# Patient Record
Sex: Female | Born: 1945 | Race: White | Hispanic: No | Smoking: Former smoker
Health system: Southern US, Community
[De-identification: ages and names within clinical notes are randomized; demographics above are authoritative.]

## PROBLEM LIST (undated history)

## (undated) DIAGNOSIS — M1990 Unspecified osteoarthritis, unspecified site: Secondary | ICD-10-CM

## (undated) DIAGNOSIS — I10 Essential (primary) hypertension: Secondary | ICD-10-CM

## (undated) DIAGNOSIS — T8859XA Other complications of anesthesia, initial encounter: Secondary | ICD-10-CM

## (undated) DIAGNOSIS — E039 Hypothyroidism, unspecified: Secondary | ICD-10-CM

## (undated) DIAGNOSIS — Z9889 Other specified postprocedural states: Secondary | ICD-10-CM

## (undated) HISTORY — PX: BREAST EXCISIONAL BIOPSY: SUR124

## (undated) HISTORY — PX: ABDOMINAL HYSTERECTOMY: SHX81

---
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
## 2002-02-17 ENCOUNTER — Other Ambulatory Visit: Admission: RE | Admit: 2002-02-17 | Discharge: 2002-02-17 | Payer: Self-pay | Admitting: Obstetrics and Gynecology

## 2002-10-30 ENCOUNTER — Ambulatory Visit (HOSPITAL_COMMUNITY): Admission: RE | Admit: 2002-10-30 | Discharge: 2002-10-30 | Payer: Self-pay | Admitting: Gastroenterology

## 2003-02-20 ENCOUNTER — Other Ambulatory Visit: Admission: RE | Admit: 2003-02-20 | Discharge: 2003-02-20 | Payer: Self-pay | Admitting: Obstetrics and Gynecology

## 2004-03-11 ENCOUNTER — Other Ambulatory Visit: Admission: RE | Admit: 2004-03-11 | Discharge: 2004-03-11 | Payer: Self-pay | Admitting: Obstetrics and Gynecology

## 2004-08-13 ENCOUNTER — Ambulatory Visit: Payer: Self-pay | Admitting: Internal Medicine

## 2004-12-21 ENCOUNTER — Emergency Department: Payer: Self-pay | Admitting: Emergency Medicine

## 2005-03-18 ENCOUNTER — Other Ambulatory Visit: Admission: RE | Admit: 2005-03-18 | Discharge: 2005-03-18 | Payer: Self-pay | Admitting: Obstetrics and Gynecology

## 2005-08-18 ENCOUNTER — Ambulatory Visit: Payer: Self-pay | Admitting: Internal Medicine

## 2006-08-30 ENCOUNTER — Ambulatory Visit: Payer: Self-pay | Admitting: Internal Medicine

## 2007-09-14 ENCOUNTER — Ambulatory Visit: Payer: Self-pay | Admitting: Internal Medicine

## 2008-03-21 DIAGNOSIS — D239 Other benign neoplasm of skin, unspecified: Secondary | ICD-10-CM

## 2008-03-21 HISTORY — DX: Other benign neoplasm of skin, unspecified: D23.9

## 2008-09-19 ENCOUNTER — Ambulatory Visit: Payer: Self-pay | Admitting: Internal Medicine

## 2009-09-20 ENCOUNTER — Ambulatory Visit: Payer: Self-pay | Admitting: Internal Medicine

## 2009-09-23 ENCOUNTER — Ambulatory Visit: Payer: Self-pay | Admitting: Internal Medicine

## 2010-09-23 ENCOUNTER — Ambulatory Visit: Payer: Self-pay | Admitting: Gastroenterology

## 2010-09-30 ENCOUNTER — Ambulatory Visit: Payer: Self-pay | Admitting: Internal Medicine

## 2011-10-01 ENCOUNTER — Ambulatory Visit: Payer: Self-pay | Admitting: Internal Medicine

## 2012-10-07 ENCOUNTER — Ambulatory Visit: Payer: Self-pay | Admitting: Internal Medicine

## 2013-09-13 ENCOUNTER — Ambulatory Visit: Payer: Self-pay | Admitting: Internal Medicine

## 2013-11-06 ENCOUNTER — Ambulatory Visit: Payer: Self-pay | Admitting: Internal Medicine

## 2013-11-07 ENCOUNTER — Ambulatory Visit: Payer: Self-pay | Admitting: Internal Medicine

## 2013-11-09 ENCOUNTER — Other Ambulatory Visit: Payer: Self-pay | Admitting: Internal Medicine

## 2013-11-09 DIAGNOSIS — R921 Mammographic calcification found on diagnostic imaging of breast: Secondary | ICD-10-CM

## 2013-11-20 ENCOUNTER — Ambulatory Visit
Admission: RE | Admit: 2013-11-20 | Discharge: 2013-11-20 | Disposition: A | Discharge status: Home / Self Care | Payer: 59 | Source: Ambulatory Visit | Attending: Internal Medicine | Admitting: Internal Medicine

## 2013-11-20 DIAGNOSIS — R921 Mammographic calcification found on diagnostic imaging of breast: Secondary | ICD-10-CM

## 2013-11-21 HISTORY — PX: BREAST BIOPSY: SHX20

## 2014-04-23 ENCOUNTER — Other Ambulatory Visit: Payer: Self-pay | Admitting: Internal Medicine

## 2014-04-23 DIAGNOSIS — R921 Mammographic calcification found on diagnostic imaging of breast: Secondary | ICD-10-CM

## 2014-05-14 ENCOUNTER — Ambulatory Visit
Admission: RE | Admit: 2014-05-14 | Discharge: 2014-05-14 | Disposition: A | Discharge status: Home / Self Care | Payer: Medicare Other | Source: Ambulatory Visit | Attending: Internal Medicine | Admitting: Internal Medicine

## 2014-05-14 DIAGNOSIS — R921 Mammographic calcification found on diagnostic imaging of breast: Secondary | ICD-10-CM

## 2014-09-07 ENCOUNTER — Other Ambulatory Visit: Payer: Self-pay | Admitting: Internal Medicine

## 2014-09-07 DIAGNOSIS — R921 Mammographic calcification found on diagnostic imaging of breast: Secondary | ICD-10-CM

## 2014-11-14 ENCOUNTER — Other Ambulatory Visit: Payer: Self-pay | Admitting: Internal Medicine

## 2014-11-14 ENCOUNTER — Ambulatory Visit
Admission: RE | Admit: 2014-11-14 | Discharge: 2014-11-14 | Disposition: A | Discharge status: Home / Self Care | Payer: Medicare Other | Source: Ambulatory Visit | Attending: Internal Medicine | Admitting: Internal Medicine

## 2014-11-14 ENCOUNTER — Ambulatory Visit: Payer: Self-pay

## 2014-11-14 DIAGNOSIS — R921 Mammographic calcification found on diagnostic imaging of breast: Secondary | ICD-10-CM

## 2015-03-25 IMAGING — MG MM LT BREAST BX W LOC DEV 1ST LESION IMAGE BX SPEC STEREO GUIDE
3 series · 3 of 3 positions shown · non-contrast
Comparison: Previous exams.

ADDENDUM:
Pathology revealed a hyalinized fibroadenoma with calcifications in
the left breast. This was found to be concordant by Dr. Klever
Nayko. Pathology was discussed with the patient by telephone. The
patient reported tenderness and bruising at the site. Post biopsy
instructions were reviewed and her questions were answered. She was
encouraged to call The [REDACTED] for any
additional concerns. She was asked to return in 6 months for
diagnostic right mammography for follow-up of calcifications.

Pathology results reported by Rinat Tabora RN, BSN on November 21, 2013.
CLINICAL DATA: Slightly suspicious left breast calcifications.
EXAM:
Left BREAST STEREOTACTIC CORE NEEDLE BIOPSY

[L SPECIMEN]
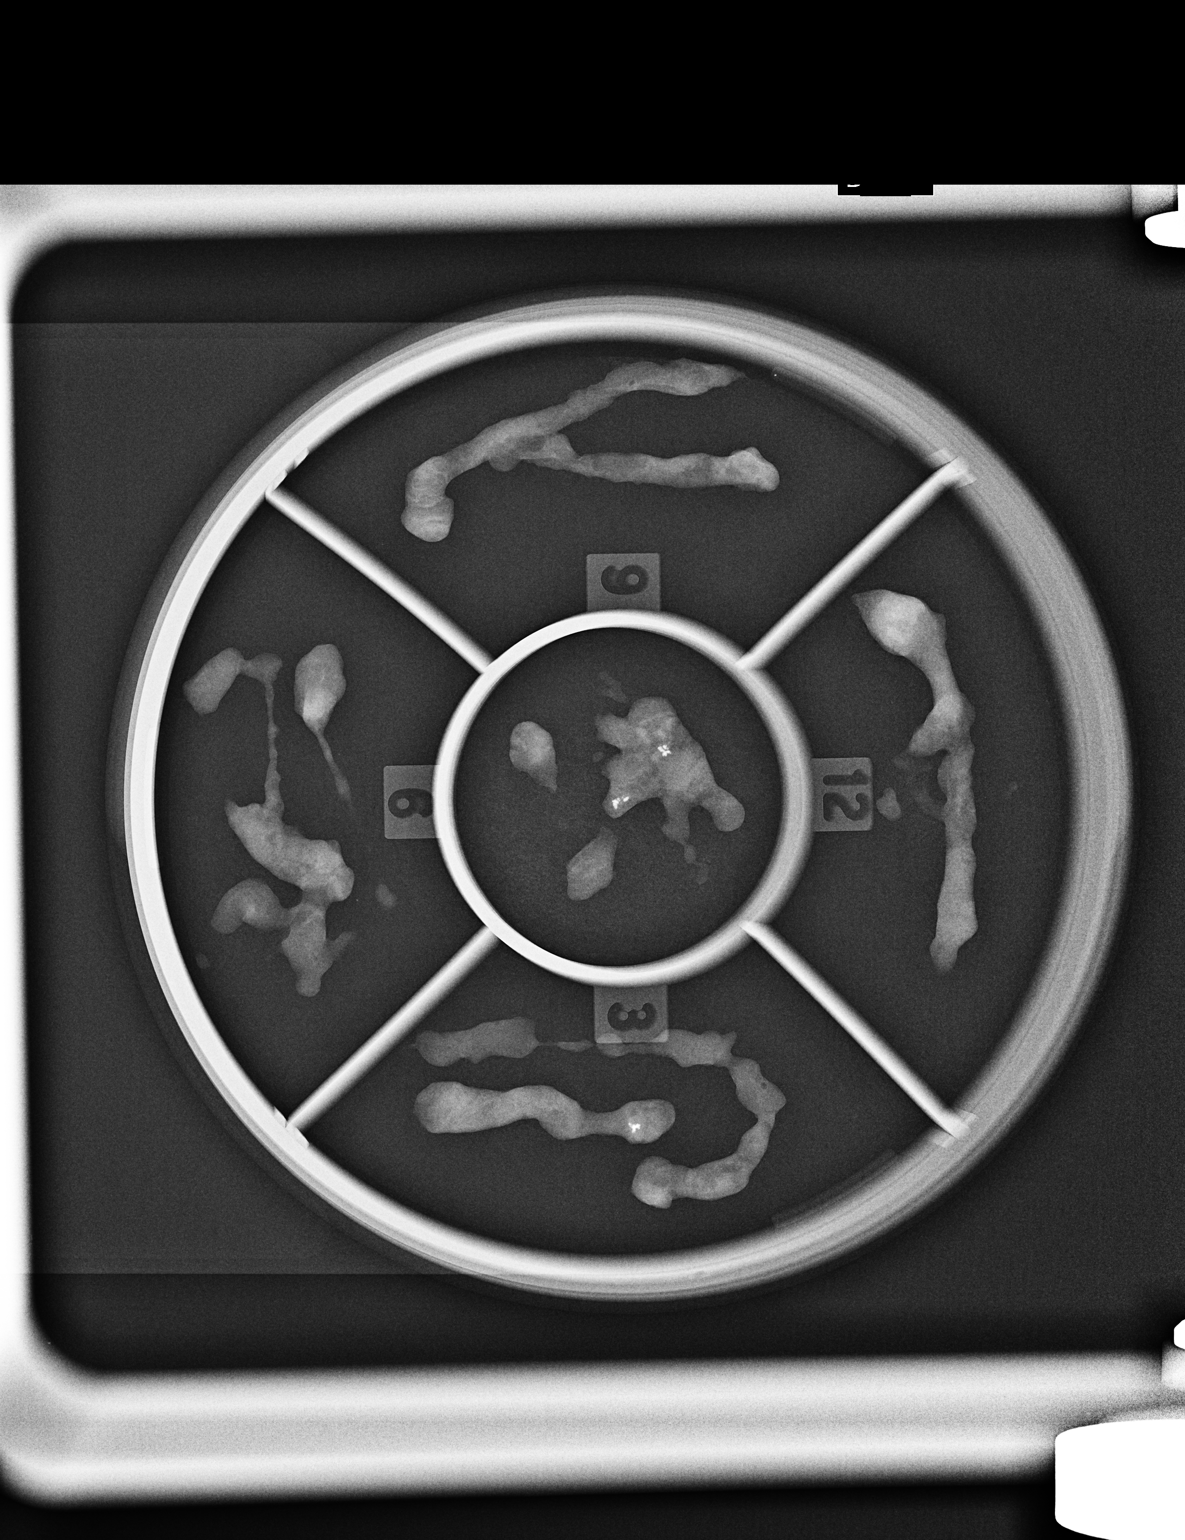

[L CC]
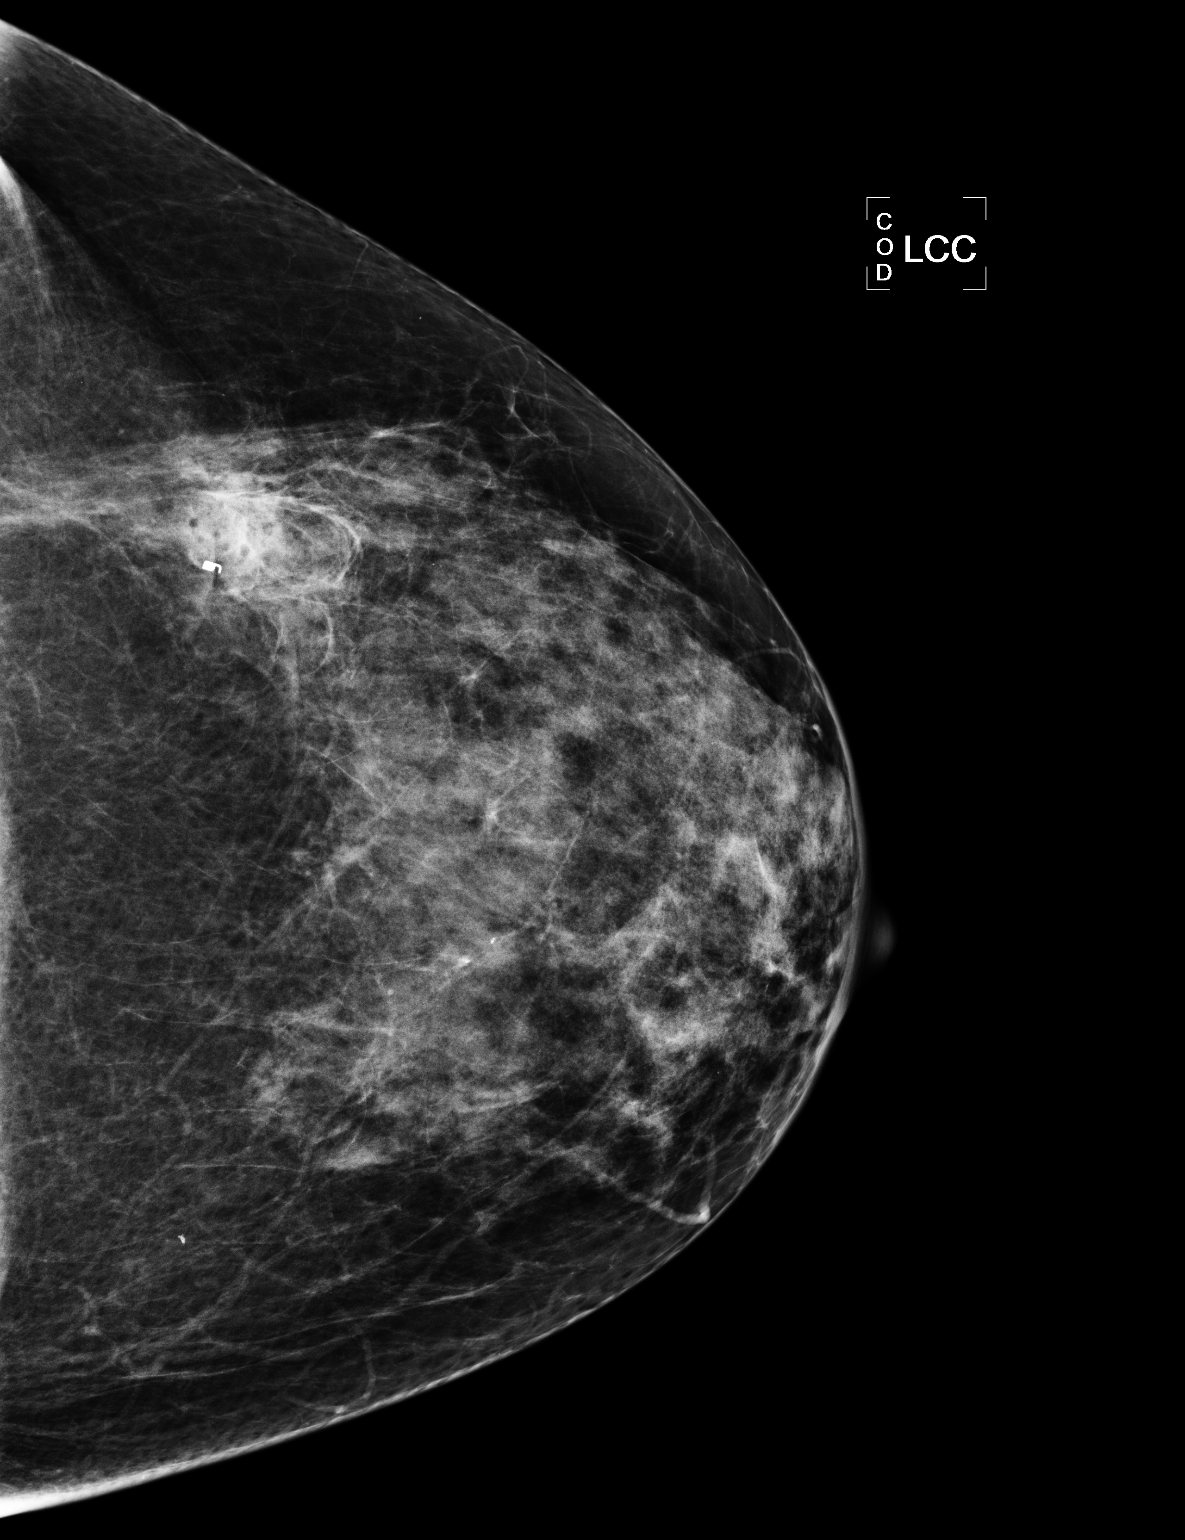

[L ML]
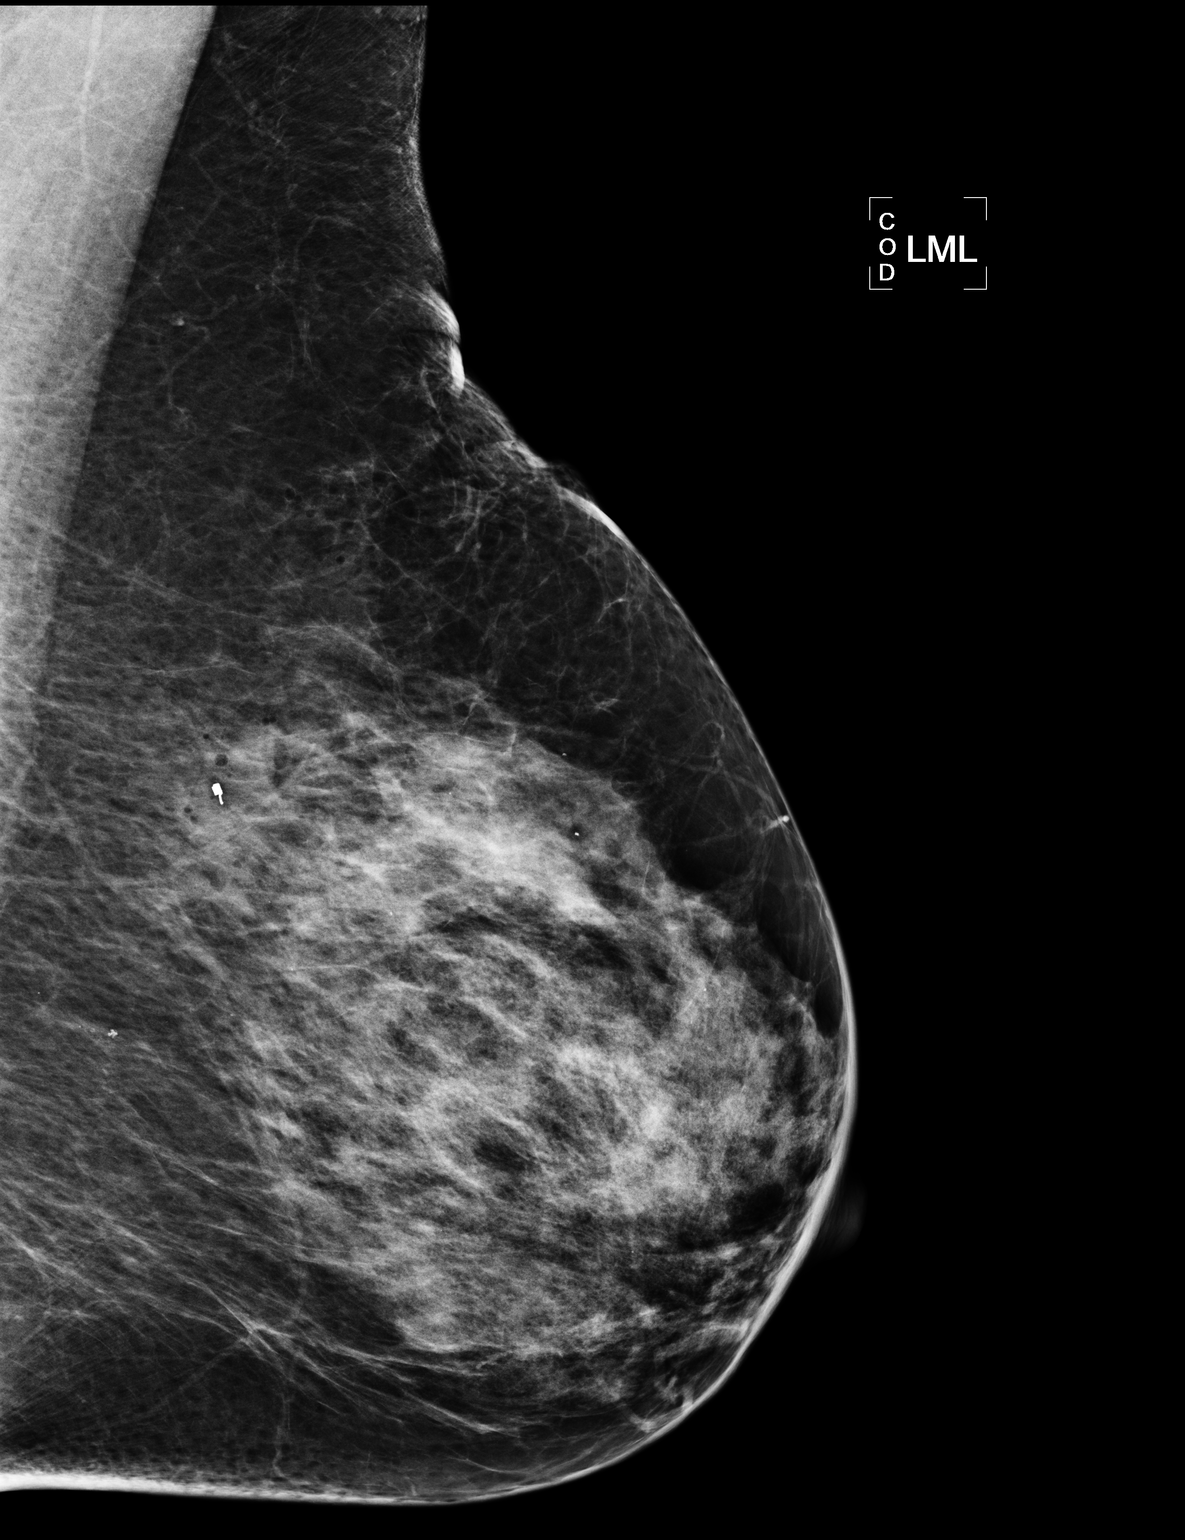

[3 of 3 positions shown; findings below may reference images not displayed]



Using sterile technique and 2% lidocaine as local anesthetic, under
stereotactic guidance, a 9 gauge vacuum assisted core biopsy device
was used to perform core needle biopsy of calcifications within the
upper outer quadrant of the left breast using a superior
craniocaudal approach. Specimen radiograph was performed showing
multiple representative calcifications within the specimen.
Specimens with calcifications are identified for pathology.

At the conclusion of the procedure, a coil shaped tissue marker clip
was deployed into the biopsy cavity. Follow-up 2-view mammogram
confirmed clip to be in appropriate position.
IMPRESSION: Stereotactic-guided biopsy of left breast calcifications as
discussed above. No apparent complications.

## 2015-06-26 ENCOUNTER — Other Ambulatory Visit: Payer: Self-pay | Admitting: Internal Medicine

## 2015-06-26 DIAGNOSIS — R921 Mammographic calcification found on diagnostic imaging of breast: Secondary | ICD-10-CM

## 2015-11-15 ENCOUNTER — Ambulatory Visit
Admission: RE | Admit: 2015-11-15 | Discharge: 2015-11-15 | Disposition: A | Discharge status: Home / Self Care | Payer: Medicare Other | Source: Ambulatory Visit | Attending: Internal Medicine | Admitting: Internal Medicine

## 2015-11-15 ENCOUNTER — Other Ambulatory Visit: Payer: Self-pay | Admitting: Internal Medicine

## 2015-11-15 DIAGNOSIS — R921 Mammographic calcification found on diagnostic imaging of breast: Secondary | ICD-10-CM

## 2016-06-26 ENCOUNTER — Other Ambulatory Visit: Payer: Self-pay | Admitting: Internal Medicine

## 2016-06-26 DIAGNOSIS — Z1231 Encounter for screening mammogram for malignant neoplasm of breast: Secondary | ICD-10-CM

## 2016-11-20 ENCOUNTER — Ambulatory Visit
Admission: RE | Admit: 2016-11-20 | Discharge: 2016-11-20 | Disposition: A | Discharge status: Home / Self Care | Payer: Medicare Other | Source: Ambulatory Visit | Attending: Internal Medicine | Admitting: Internal Medicine

## 2016-11-20 DIAGNOSIS — Z1231 Encounter for screening mammogram for malignant neoplasm of breast: Secondary | ICD-10-CM | POA: Diagnosis present

## 2017-06-30 ENCOUNTER — Other Ambulatory Visit: Payer: Self-pay | Admitting: Internal Medicine

## 2017-06-30 ENCOUNTER — Other Ambulatory Visit: Payer: Self-pay | Admitting: Podiatry

## 2017-06-30 DIAGNOSIS — Z1231 Encounter for screening mammogram for malignant neoplasm of breast: Secondary | ICD-10-CM

## 2017-07-06 ENCOUNTER — Other Ambulatory Visit: Payer: Self-pay | Admitting: Podiatry

## 2017-07-06 NOTE — Discharge Instructions (Signed)
[facility] °[facility] ° °POST OPERATIVE INSTRUCTIONS FOR DR. TROXLER AND DR. FOWLER °[facility] ° ° °1. Take your medication as prescribed.  Pain medication should be taken only as needed. ° °2. Keep the dressing clean, dry and intact. ° °3. Keep your foot elevated above the heart level for the first 48 hours. ° °4. Walking to the bathroom and brief periods of walking are acceptable, unless we have instructed you to be non-weight bearing. ° °5. Always wear your post-op shoe when walking.  Always use your crutches if you are to be non-weight bearing. ° °6. Do not take a shower. Baths are permissible as long as the foot is kept out of the water.  ° °7. Every hour you are awake:  °- Bend your knee 15 times. °- Flex foot 15 times °- Massage calf 15 times ° °8. Call [facility] ([phone]) if any of the following problems occur: °- You develop a temperature or fever. °- The bandage becomes saturated with blood. °- Medication does not stop your pain. °- Injury of the foot occurs. °- Any symptoms of infection including redness, odor, or red streaks running from wound. ° ° °General Anesthesia, Adult, Care After °These instructions provide you with information about caring for yourself after your procedure. Your health care provider may also give you more specific instructions. Your treatment has been planned according to current medical practices, but problems sometimes occur. Call your health care provider if you have any problems or questions after your procedure. °What can I expect after the procedure? °After the procedure, it is common to have: °· Vomiting. °· A sore throat. °· Mental slowness. ° °It is common to feel: °· Nauseous. °· Cold or shivery. °· Sleepy. °· Tired. °· Sore or achy, even in parts of your body where you did not have surgery. ° °Follow these instructions at home: °For at least 24 hours after the procedure: °· Do not: °? Participate in  activities where you could fall or become injured. °? Drive. °? Use heavy machinery. °? Drink alcohol. °? Take sleeping pills or medicines that cause drowsiness. °? Make important decisions or sign legal documents. °? Take care of children on your own. °· Rest. °Eating and drinking °· If you vomit, drink water, juice, or soup when you can drink without vomiting. °· Drink enough fluid to keep your urine clear or pale yellow. °· Make sure you have little or no nausea before eating solid foods. °· Follow the diet recommended by your health care provider. °General instructions °· Have a responsible adult stay with you until you are awake and alert. °· Return to your normal activities as told by your health care provider. Ask your health care provider what activities are safe for you. °· Take over-the-counter and prescription medicines only as told by your health care provider. °· If you smoke, do not smoke without supervision. °· Keep all follow-up visits as told by your health care provider. This is important. °Contact a health care provider if: °· You continue to have nausea or vomiting at home, and medicines are not helpful. °· You cannot drink fluids or start eating again. °· You cannot urinate after 8-12 hours. °· You develop a skin rash. °· You have fever. °· You have increasing redness at the site of your procedure. °Get help right away if: °· You have difficulty breathing. °· You have chest pain. °· You have unexpected bleeding. °· You feel that you   are having a life-threatening or urgent problem. °This information is not intended to replace advice given to you by your health care provider. Make sure you discuss any questions you have with your health care provider. °Document Released: 08/17/2000 Document Revised: 10/14/2015 Document Reviewed: 04/25/2015 °Elsevier Interactive Patient Education © 2018 [employer] ° °

## 2017-07-07 ENCOUNTER — Ambulatory Visit: Payer: Medicare Other | Admitting: Anesthesiology

## 2017-07-07 ENCOUNTER — Encounter: Payer: Self-pay | Admitting: *Deleted

## 2017-07-07 ENCOUNTER — Ambulatory Visit
Admission: RE | Admit: 2017-07-07 | Discharge: 2017-07-07 | Disposition: A | Discharge status: Home / Self Care | Payer: Medicare Other | Source: Ambulatory Visit | Attending: Podiatry | Admitting: Podiatry

## 2017-07-07 ENCOUNTER — Encounter
Admission: RE | Disposition: A | Discharge status: Home / Self Care | Payer: Self-pay | Source: Ambulatory Visit | Attending: Podiatry

## 2017-07-07 DIAGNOSIS — Z79899 Other long term (current) drug therapy: Secondary | ICD-10-CM | POA: Insufficient documentation

## 2017-07-07 DIAGNOSIS — Z888 Allergy status to other drugs, medicaments and biological substances status: Secondary | ICD-10-CM | POA: Insufficient documentation

## 2017-07-07 DIAGNOSIS — Z885 Allergy status to narcotic agent status: Secondary | ICD-10-CM | POA: Insufficient documentation

## 2017-07-07 DIAGNOSIS — M898X7 Other specified disorders of bone, ankle and foot: Secondary | ICD-10-CM | POA: Diagnosis not present

## 2017-07-07 DIAGNOSIS — E039 Hypothyroidism, unspecified: Secondary | ICD-10-CM | POA: Insufficient documentation

## 2017-07-07 DIAGNOSIS — Z87891 Personal history of nicotine dependence: Secondary | ICD-10-CM | POA: Insufficient documentation

## 2017-07-07 DIAGNOSIS — M2012 Hallux valgus (acquired), left foot: Secondary | ICD-10-CM | POA: Insufficient documentation

## 2017-07-07 DIAGNOSIS — Z7982 Long term (current) use of aspirin: Secondary | ICD-10-CM | POA: Diagnosis not present

## 2017-07-07 DIAGNOSIS — M19042 Primary osteoarthritis, left hand: Secondary | ICD-10-CM | POA: Diagnosis not present

## 2017-07-07 DIAGNOSIS — E785 Hyperlipidemia, unspecified: Secondary | ICD-10-CM | POA: Insufficient documentation

## 2017-07-07 DIAGNOSIS — I10 Essential (primary) hypertension: Secondary | ICD-10-CM | POA: Insufficient documentation

## 2017-07-07 DIAGNOSIS — Z91041 Radiographic dye allergy status: Secondary | ICD-10-CM | POA: Diagnosis not present

## 2017-07-07 DIAGNOSIS — M19041 Primary osteoarthritis, right hand: Secondary | ICD-10-CM | POA: Diagnosis not present

## 2017-07-07 HISTORY — DX: Hypothyroidism, unspecified: E03.9

## 2017-07-07 HISTORY — DX: Unspecified osteoarthritis, unspecified site: M19.90

## 2017-07-07 HISTORY — DX: Essential (primary) hypertension: I10

## 2017-07-07 HISTORY — PX: METATARSAL OSTEOTOMY: SHX1641

## 2017-07-07 HISTORY — PX: EXCISION PARTIAL PHALANX: SHX6617

## 2017-07-07 HISTORY — DX: Other specified postprocedural states: Z98.890

## 2017-07-07 SURGERY — OSTEOTOMY, METATARSAL BONE
Anesthesia: General | Site: Foot | Laterality: Left | Wound class: Clean

## 2017-07-07 MED ORDER — BUPIVACAINE LIPOSOME 1.3 % IJ SUSP
INTRAMUSCULAR | Status: DC | PRN
  Administered 2017-07-07: 10 mL

## 2017-07-07 MED ORDER — ONDANSETRON HCL 4 MG/2ML IJ SOLN
INTRAMUSCULAR | Status: DC | PRN
  Administered 2017-07-07: 4 mg via INTRAVENOUS

## 2017-07-07 MED ORDER — LIDOCAINE HCL (CARDIAC) 20 MG/ML IV SOLN
INTRAVENOUS | Status: DC | PRN
  Administered 2017-07-07: 40 mg via INTRATRACHEAL

## 2017-07-07 MED ORDER — PROMETHAZINE HCL 12.5 MG PO TABS: 12.5000 mg | ORAL_TABLET | Freq: Four times a day (QID) | ORAL | 0 refills | Status: DC | PRN

## 2017-07-07 MED ORDER — CEFAZOLIN SODIUM-DEXTROSE 2-4 GM/100ML-% IV SOLN
2.0000 g | INTRAVENOUS | Status: AC
  Administered 2017-07-07: 2 g via INTRAVENOUS

## 2017-07-07 MED ORDER — FENTANYL CITRATE (PF) 100 MCG/2ML IJ SOLN
INTRAMUSCULAR | Status: DC | PRN
  Administered 2017-07-07: 50 ug via INTRAVENOUS

## 2017-07-07 MED ORDER — LACTATED RINGERS IV SOLN
10.0000 mL/h | INTRAVENOUS | Status: DC
  Administered 2017-07-07: 10 mL/h via INTRAVENOUS

## 2017-07-07 MED ORDER — MIDAZOLAM HCL 5 MG/5ML IJ SOLN
INTRAMUSCULAR | Status: DC | PRN
  Administered 2017-07-07: 2 mg via INTRAVENOUS

## 2017-07-07 MED ORDER — PROMETHAZINE HCL 25 MG/ML IJ SOLN: 6.2500 mg | INTRAMUSCULAR | Status: DC | PRN

## 2017-07-07 MED ORDER — ONDANSETRON HCL 4 MG/2ML IJ SOLN: 4.0000 mg | Freq: Four times a day (QID) | INTRAMUSCULAR | Status: DC | PRN

## 2017-07-07 MED ORDER — ONDANSETRON HCL 4 MG PO TABS: 4.0000 mg | ORAL_TABLET | Freq: Four times a day (QID) | ORAL | Status: DC | PRN

## 2017-07-07 MED ORDER — DEXAMETHASONE SODIUM PHOSPHATE 4 MG/ML IJ SOLN
INTRAMUSCULAR | Status: DC | PRN
  Administered 2017-07-07: 4 mg via INTRAVENOUS

## 2017-07-07 MED ORDER — PROPOFOL 10 MG/ML IV BOLUS
INTRAVENOUS | Status: DC | PRN
  Administered 2017-07-07: 140 mg via INTRAVENOUS

## 2017-07-07 MED ORDER — MEPERIDINE HCL 25 MG/ML IJ SOLN: 6.2500 mg | INTRAMUSCULAR | Status: DC | PRN

## 2017-07-07 MED ORDER — OXYCODONE HCL 5 MG/5ML PO SOLN: 5.0000 mg | Freq: Once | ORAL | Status: DC | PRN

## 2017-07-07 MED ORDER — OXYCODONE HCL 5 MG PO TABS: 5.0000 mg | ORAL_TABLET | Freq: Once | ORAL | Status: DC | PRN

## 2017-07-07 MED ORDER — ACETAMINOPHEN 325 MG PO TABS
650.0000 mg | ORAL_TABLET | Freq: Once | ORAL | Status: AC
  Administered 2017-07-07: 650 mg via ORAL

## 2017-07-07 MED ORDER — HYDROCODONE-ACETAMINOPHEN 5-325 MG PO TABS: 1.0000 | ORAL_TABLET | Freq: Four times a day (QID) | ORAL | 0 refills | Status: DC | PRN

## 2017-07-07 MED ORDER — FENTANYL CITRATE (PF) 100 MCG/2ML IJ SOLN: 25.0000 ug | INTRAMUSCULAR | Status: DC | PRN

## 2017-07-07 MED ORDER — GLYCOPYRROLATE 0.2 MG/ML IJ SOLN
INTRAMUSCULAR | Status: DC | PRN
  Administered 2017-07-07: 0.1 mg via INTRAVENOUS

## 2017-07-07 MED ORDER — CHLORHEXIDINE GLUCONATE 4 % EX LIQD: 60.0000 mL | Freq: Once | CUTANEOUS | Status: DC

## 2017-07-07 MED ORDER — BUPIVACAINE HCL (PF) 0.25 % IJ SOLN
INTRAMUSCULAR | Status: DC | PRN
  Administered 2017-07-07: 10 mL

## 2017-07-07 SURGICAL SUPPLY — 53 items
APL SKNCLS STERI-STRIP NONHPOA (GAUZE/BANDAGES/DRESSINGS) ×1
APPLICATOR CHLORAPREP 1ML CHG (MISCELLANEOUS) ×1 IMPLANT
BANDAGE ELASTIC 4 VELCRO NS (GAUZE/BANDAGES/DRESSINGS) ×2 IMPLANT
BENZOIN TINCTURE PRP APPL 2/3 (GAUZE/BANDAGES/DRESSINGS) ×2 IMPLANT
BLADE MED AGGRESSIVE (BLADE) ×1 IMPLANT
BLADE MINI RND TIP GREEN BEAV (BLADE) ×1 IMPLANT
BLADE OSC/SAGITTAL MD 5.5X18 (BLADE) ×1 IMPLANT
BLADE SURG 15 STRL LF DISP TIS (BLADE) IMPLANT
BLADE SURG 15 STRL SS (BLADE)
BNDG CMPR 75X41 PLY HI ABS (GAUZE/BANDAGES/DRESSINGS) ×1
BNDG COHESIVE 4X5 TAN STRL (GAUZE/BANDAGES/DRESSINGS) ×2 IMPLANT
BNDG ESMARK 4X12 TAN STRL LF (GAUZE/BANDAGES/DRESSINGS) ×2 IMPLANT
BNDG GAUZE 4.5X4.1 6PLY STRL (MISCELLANEOUS) ×2 IMPLANT
BNDG STRETCH 4X75 STRL LF (GAUZE/BANDAGES/DRESSINGS) ×2 IMPLANT
CANISTER SUCT 1200ML W/VALVE (MISCELLANEOUS) ×2 IMPLANT
CHLORAPREP W/TINT 26ML (MISCELLANEOUS) ×1 IMPLANT
COVER LIGHT HANDLE UNIVERSAL (MISCELLANEOUS) ×4 IMPLANT
CUFF TOURN SGL QUICK 18 (TOURNIQUET CUFF) ×1 IMPLANT
DRAPE FLUOR MINI C-ARM 54X84 (DRAPES) ×2 IMPLANT
ELECT REM PT RETURN 9FT ADLT (ELECTROSURGICAL) ×2
ELECTRODE REM PT RTRN 9FT ADLT (ELECTROSURGICAL) ×1 IMPLANT
GAUZE PETRO XEROFOAM 1X8 (MISCELLANEOUS) ×2 IMPLANT
GAUZE SPONGE 4X4 12PLY STRL (GAUZE/BANDAGES/DRESSINGS) ×2 IMPLANT
GLOVE BIO SURGEON STRL SZ7.5 (GLOVE) ×3 IMPLANT
GLOVE INDICATOR 8.0 STRL GRN (GLOVE) ×3 IMPLANT
GOWN STRL REUS W/ TWL LRG LVL3 (GOWN DISPOSABLE) ×2 IMPLANT
GOWN STRL REUS W/TWL LRG LVL3 (GOWN DISPOSABLE) ×4
K-WIRE DBL END TROCAR 6X.045 (WIRE) ×2
K-WIRE DBL END TROCAR 6X.062 (WIRE)
K-WIRE DBL TROCAR .062X4 ×2 IMPLANT
KIT TURNOVER KIT A (KITS) ×2 IMPLANT
KWIRE DBL END TROCAR 6X.045 (WIRE) IMPLANT
KWIRE DBL END TROCAR 6X.062 (WIRE) IMPLANT
KWIRE DBL TROCAR .062X4 IMPLANT
NS IRRIG 500ML POUR BTL (IV SOLUTION) ×2 IMPLANT
PACK EXTREMITY ARMC (MISCELLANEOUS) ×2 IMPLANT
PIN BALLS 3/8 F/.045 WIRE (MISCELLANEOUS) ×1 IMPLANT
RASP SM TEAR CROSS CUT (RASP) ×1 IMPLANT
STAPLE BONE 9X9X9 (Staple) ×1 IMPLANT
STOCKINETTE IMPERVIOUS LG (DRAPES) ×2 IMPLANT
STRAP BODY AND KNEE 60X3 (MISCELLANEOUS) ×1 IMPLANT
STRIP CLOSURE SKIN 1/4X4 (GAUZE/BANDAGES/DRESSINGS) ×2 IMPLANT
SUT ETHILON 4-0 (SUTURE) ×2
SUT ETHILON 4-0 FS2 18XMFL BLK (SUTURE) ×1
SUT ETHILON 5-0 FS-2 18 BLK (SUTURE) IMPLANT
SUT MNCRL 5-0+ PC-1 (SUTURE) IMPLANT
SUT MONOCRYL 5-0 (SUTURE) ×1
SUT VIC AB 2-0 SH 27 (SUTURE)
SUT VIC AB 2-0 SH 27XBRD (SUTURE) IMPLANT
SUT VIC AB 3-0 SH 27 (SUTURE) ×2
SUT VIC AB 3-0 SH 27X BRD (SUTURE) IMPLANT
SUT VIC AB 4-0 FS2 27 (SUTURE) ×1 IMPLANT
SUTURE ETHLN 4-0 FS2 18XMF BLK (SUTURE) IMPLANT

## 2017-07-07 NOTE — Anesthesia Preprocedure Evaluation (Signed)
Anesthesia Evaluation  Patient identified by MRN, date of birth, ID band Patient awake    Reviewed: Allergy & Precautions, H&P , NPO status , Patient's Chart, lab work & pertinent test results, reviewed documented beta blocker date and time   Airway Mallampati: II  TM Distance: >3 FB Neck ROM: full    Dental no notable dental hx.    Pulmonary neg pulmonary ROS, former smoker,    Pulmonary exam normal breath sounds clear to auscultation       Cardiovascular Exercise Tolerance: Good hypertension, negative cardio ROS   Rhythm:regular Rate:Normal     Neuro/Psych negative neurological ROS  negative psych ROS   GI/Hepatic negative GI ROS, Neg liver ROS,   Endo/Other  Hypothyroidism   Renal/GU negative Renal ROS  negative genitourinary   Musculoskeletal   Abdominal   Peds  Hematology negative hematology ROS (+)   Anesthesia Other Findings Arthritis  Reproductive/Obstetrics negative OB ROS                             Anesthesia Physical Anesthesia Plan  ASA: II  Anesthesia Plan: General LMA   Post-op Pain Management:    Induction:   PONV Risk Score and Plan:   Airway Management Planned:   Additional Equipment:   Intra-op Plan:   Post-operative Plan:   Informed Consent: I have reviewed the patients History and Physical, chart, labs and discussed the procedure including the risks, benefits and alternatives for the proposed anesthesia with the patient or authorized representative who has indicated his/her understanding and acceptance.   Dental Advisory Given  Plan Discussed with: CRNA  Anesthesia Plan Comments:         Anesthesia Quick Evaluation

## 2017-07-07 NOTE — Anesthesia Postprocedure Evaluation (Signed)
Anesthesia Post Note  Patient: Pamela Bowen  Procedure(s) Performed: METATARSAL OSTEOTOMY & GREAT TOE(DOUBLE OSTEOTOMY) (Left Foot) EXCISION PARTIAL PHALANX-T7 (Left Foot)  Patient location during evaluation: PACU Anesthesia Type: General Level of consciousness: awake and alert Pain management: pain level controlled Vital Signs Assessment: post-procedure vital signs reviewed and stable Respiratory status: spontaneous breathing, nonlabored ventilation, respiratory function stable and patient connected to nasal cannula oxygen Cardiovascular status: blood pressure returned to baseline and stable Postop Assessment: no apparent nausea or vomiting Anesthetic complications: no    Alessa Mazur ELAINE

## 2017-07-07 NOTE — H&P (Signed)
HISTORY AND PHYSICAL INTERVAL NOTE:  07/07/2017  11:41 AM  Pamela Bowen  has presented today for surgery, with the diagnosis of Hallux Valgus-Left Hammertoe - Left.  The various methods of treatment have been discussed with the patient.  No guarantees were given.  After consideration of risks, benefits and other options for treatment, the patient has consented to surgery.  I have reviewed the patients' chart and labs.    No data found.  A history and physical examination was performed in my office.  The patient was reexamined.  There have been no changes to this history and physical examination.  Samara Deist A

## 2017-07-07 NOTE — Anesthesia Procedure Notes (Signed)
Procedure Name: LMA Insertion Date/Time: 07/07/2017 12:04 PM Performed by: Mayme Genta, CRNA Pre-anesthesia Checklist: Patient identified, Emergency Drugs available, Suction available, Timeout performed and Patient being monitored Patient Re-evaluated:Patient Re-evaluated prior to induction Oxygen Delivery Method: Circle system utilized Preoxygenation: Pre-oxygenation with 100% oxygen Induction Type: IV induction LMA: LMA inserted LMA Size: 4.0 Number of attempts: 1 Placement Confirmation: positive ETCO2 and breath sounds checked- equal and bilateral Tube secured with: Tape

## 2017-07-07 NOTE — Transfer of Care (Signed)
Immediate Anesthesia Transfer of Care Note  Patient: Pamela Bowen  Procedure(s) Performed: METATARSAL OSTEOTOMY & GREAT TOE(DOUBLE OSTEOTOMY) (Left Foot) EXCISION PARTIAL PHALANX-T7 (Left Foot)  Patient Location: PACU  Anesthesia Type: General LMA  Level of Consciousness: awake, alert  and patient cooperative  Airway and Oxygen Therapy: Patient Spontanous Breathing and Patient connected to supplemental oxygen  Post-op Assessment: Post-op Vital signs reviewed, Patient's Cardiovascular Status Stable, Respiratory Function Stable, Patent Airway and No signs of Nausea or vomiting  Post-op Vital Signs: Reviewed and stable  Complications: No apparent anesthesia complications

## 2017-07-07 NOTE — Op Note (Signed)
Operative note   Surgeon:Carley Glendenning    Assistant:None    Preop diagnosis: 1.  Left foot hallux valgus deformity 2.  Exostosis left second toe 3.  Exostosis left great toe    Postop diagnosis: Same    Procedure:1.  Austin with Akin double osteotomy hallux valgus correction left foot 2.  Exostectomy PIPJ left second toe 3.  Exostectomy distal lateral phalanx great toe    EBL: Minimal    Anesthesia:local and general.  Local consisted of a one-to-one mixture of 0.25% bupivacaine and Exparel long-acting anesthetic.  A total of 20 cc was used.    Hemostasis: Ankle tourniquet inflated to 200 mmHg for approximately 60 minutes    Specimen: None    Complications: None    Operative indications:Pamela Bowen is an 72 y.o. that presents today for surgical intervention.  The risks/benefits/alternatives/complications have been discussed and consent has been given.    Procedure:  Patient was brought into the OR and placed on the operating table in thesupine position. After anesthesia was obtained theleft lower extremity was prepped and draped in usual sterile fashion.  Attention was directed to the dorsomedial left first MTPJ where a dorsal medial incision was performed.  Sharp and blunt dissection carried down to the capsule.  A T capsulotomy was then performed.  The dorsomedial eminence was noted and transected with a power saw.  A V osteotomy was created in the distal first metatarsal.  The capital fragment was translocated laterally.  This was stabilized with a 0.062 K wire.  This was bent appropriately against the first metatarsal.  Attention was then directed to the proximal phalanx where a small Akin wedge osteotomy was created.  The wedge was removed.  The lateral cortex was left intact.  This was compressed and stabilized with a 9 mm compression staple.  The wound was flushed with copious amounts of irrigation.  A small capsulorrhaphy was performed medially.  Closure was performed with a  3-0 Vicryl 4-0 Vicryl and 5-0 Monocryl for skin.  Attention was then directed to the medial aspect of the second toe at the PIPJ were incision was made.  Sharp and blunt dissection carried down to the capsule.  The capsule was exposed and the medial portion of the proximal phalangeal head was noted.  This was then transected with a power saw.  This was flushed with copious amounts of irrigation.  Closure was performed with a 4-0 Vicryl and 4-0 nylon.  Final incision was made at the distal lateral aspect of the great toe overlying the distal lateral phalanx.  Sharp and blunt dissection carried down to the phalanx.  Subperiosteal dissection exposed the lateral portion of the distal phalanx.  Lateral exostosis of the phalanx was excised with a power saw.  This was flushed with copious amounts of irrigation.  Closure was performed with a 4-0 Vicryl and 4-0 nylon.  The dorsomedial incision was closed with 5-0 Monocryl.  A well compressive sterile dressing was then applied.    Patient tolerated the procedure and anesthesia well.  Was transported from the OR to the PACU with all vital signs stable and vascular status intact. To be discharged per routine protocol.  Will follow up in approximately 1 week in the outpatient clinic.

## 2017-07-08 ENCOUNTER — Encounter: Payer: Self-pay | Admitting: Podiatry

## 2017-11-24 ENCOUNTER — Ambulatory Visit
Admission: RE | Admit: 2017-11-24 | Discharge: 2017-11-24 | Disposition: A | Discharge status: Home / Self Care | Payer: Medicare Other | Source: Ambulatory Visit | Attending: Internal Medicine | Admitting: Internal Medicine

## 2017-11-24 DIAGNOSIS — Z1231 Encounter for screening mammogram for malignant neoplasm of breast: Secondary | ICD-10-CM

## 2018-07-22 ENCOUNTER — Other Ambulatory Visit: Payer: Self-pay | Admitting: Internal Medicine

## 2018-07-22 DIAGNOSIS — Z1231 Encounter for screening mammogram for malignant neoplasm of breast: Secondary | ICD-10-CM

## 2019-01-13 ENCOUNTER — Other Ambulatory Visit: Payer: Self-pay

## 2019-01-13 ENCOUNTER — Ambulatory Visit
Admission: RE | Admit: 2019-01-13 | Discharge: 2019-01-13 | Disposition: A | Discharge status: Home / Self Care | Payer: Medicare Other | Source: Ambulatory Visit | Attending: Internal Medicine | Admitting: Internal Medicine

## 2019-01-13 DIAGNOSIS — Z1231 Encounter for screening mammogram for malignant neoplasm of breast: Secondary | ICD-10-CM | POA: Insufficient documentation

## 2019-06-06 ENCOUNTER — Ambulatory Visit: Payer: Medicare Other | Attending: Internal Medicine

## 2019-06-06 DIAGNOSIS — Z23 Encounter for immunization: Secondary | ICD-10-CM | POA: Insufficient documentation

## 2019-06-06 NOTE — Progress Notes (Signed)
   Covid-19 Vaccination Clinic  Name:  Pamela Bowen    MRN: [id number] DOB: [birth year]  06/06/2019  Pamela Bowen was observed post Covid-19 immunization for 15 minutes without incidence. She was provided with Vaccine Information Sheet and instruction to access the V-Safe system.   Pamela Bowen was instructed to call 911 with any severe reactions post vaccine: Marland Kitchen Difficulty breathing  . Swelling of your face and throat  . A fast heartbeat  . A bad rash all over your body  . Dizziness and weakness    Immunizations Administered    Name Date Dose VIS Date Route   Pfizer COVID-19 Vaccine 06/06/2019 12:13 PM 0.3 mL 05/05/2019 Intramuscular   Manufacturer: [employer], [employer]   Lot: [id number]   [place]: [id number]

## 2019-06-26 ENCOUNTER — Ambulatory Visit: Payer: Medicare PPO | Attending: Internal Medicine

## 2019-06-26 ENCOUNTER — Ambulatory Visit: Payer: Self-pay

## 2019-06-26 DIAGNOSIS — Z23 Encounter for immunization: Secondary | ICD-10-CM | POA: Insufficient documentation

## 2019-06-26 NOTE — Progress Notes (Signed)
   Covid-19 Vaccination Clinic  Name:  Pamela Bowen    MRN: [id number] DOB: 1946/01/02  06/26/2019  Pamela Bowen was observed post Covid-19 immunization for 15 minutes without incidence. She was provided with Vaccine Information Sheet and instruction to access the V-Safe system.   Pamela Bowen was instructed to call 911 with any severe reactions post vaccine: Marland Kitchen Difficulty breathing  . Swelling of your face and throat  . A fast heartbeat  . A bad rash all over your body  . Dizziness and weakness    Immunizations Administered    Name Date Dose VIS Date Route   Pfizer COVID-19 Vaccine 06/26/2019  9:32 AM 0.3 mL 05/05/2019 Intramuscular   Manufacturer: [place]   Lot: [id number]   [place]: [id number]

## 2019-07-26 ENCOUNTER — Other Ambulatory Visit: Payer: Self-pay | Admitting: Internal Medicine

## 2019-07-26 DIAGNOSIS — Z1231 Encounter for screening mammogram for malignant neoplasm of breast: Secondary | ICD-10-CM

## 2019-08-14 ENCOUNTER — Ambulatory Visit: Payer: Medicare PPO | Admitting: Dermatology

## 2019-08-14 ENCOUNTER — Other Ambulatory Visit: Payer: Self-pay

## 2019-08-14 ENCOUNTER — Encounter: Payer: Self-pay | Admitting: Dermatology

## 2019-08-14 DIAGNOSIS — L853 Xerosis cutis: Secondary | ICD-10-CM

## 2019-08-14 DIAGNOSIS — D1801 Hemangioma of skin and subcutaneous tissue: Secondary | ICD-10-CM

## 2019-08-14 DIAGNOSIS — L814 Other melanin hyperpigmentation: Secondary | ICD-10-CM

## 2019-08-14 DIAGNOSIS — L578 Other skin changes due to chronic exposure to nonionizing radiation: Secondary | ICD-10-CM

## 2019-08-14 DIAGNOSIS — L821 Other seborrheic keratosis: Secondary | ICD-10-CM

## 2019-08-14 DIAGNOSIS — I781 Nevus, non-neoplastic: Secondary | ICD-10-CM

## 2019-08-14 DIAGNOSIS — L820 Inflamed seborrheic keratosis: Secondary | ICD-10-CM

## 2019-08-14 DIAGNOSIS — L570 Actinic keratosis: Secondary | ICD-10-CM | POA: Diagnosis not present

## 2019-08-14 DIAGNOSIS — L309 Dermatitis, unspecified: Secondary | ICD-10-CM

## 2019-08-14 DIAGNOSIS — Z86018 Personal history of other benign neoplasm: Secondary | ICD-10-CM

## 2019-08-14 DIAGNOSIS — Z1283 Encounter for screening for malignant neoplasm of skin: Secondary | ICD-10-CM | POA: Diagnosis not present

## 2019-08-14 DIAGNOSIS — D225 Melanocytic nevi of trunk: Secondary | ICD-10-CM

## 2019-08-14 DIAGNOSIS — D229 Melanocytic nevi, unspecified: Secondary | ICD-10-CM

## 2019-08-14 MED ORDER — TRIAMCINOLONE ACETONIDE 0.1 % EX OINT: 1.0000 "application " | TOPICAL_OINTMENT | Freq: Two times a day (BID) | CUTANEOUS | 1 refills | Status: DC

## 2019-08-14 NOTE — Progress Notes (Signed)
Follow-Up Visit   Subjective  Pamela Bowen is a 74 y.o. female who presents for the following: Annual Exam (hx of dysplastic nevi Mid back, R lower leg), Eczema (hands, used topical steroid in past), and spot (L arm, lightens then gets dark again).   The following portions of the chart were reviewed this encounter and updated as appropriate:     Review of Systems: No other skin or systemic complaints.  Objective  Well appearing patient in no apparent distress; mood and affect are within normal limits.  A full examination was performed including scalp, head, eyes, ears, nose, lips, neck, chest, axillae, abdomen, back, buttocks, bilateral upper extremities, bilateral lower extremities, hands, feet, fingers, toes, fingernails, and toenails. All findings within normal limits unless otherwise noted below.  Objective  Nasal dorsum x 2 (2): Pink scaly macules   Objective  chest, face: Actinic changes   Objective  bil hands: Hyperkeratosis fingertips with small fissures and mild erythema, xerosis palms and hands  Objective  chest: Red paps.  Objective  Mid Back, R lower leg: Dysplastic nevi sites clear.  Objective    Right upper Flank at braline x 1: Waxy brown papule with erythema- irritated by bra  L mid forearm x 1,: violaceous waxy flat pap C/W ISK with underlying purpura  Objective  back: Scattered tan macules.   Objective  Right Upper Back: 4.61mm medium brown macule  Objective  Back, Right pretibia: Waxy tan patch.  Objective  bil legs: Telangiectasias legs  Objective  arms, legs, trunk, hands: Severe xerosis, body, legs, palms, hands  Assessment & Plan  AK (actinic keratosis) (2) Nasal dorsum x 2  Destruction of lesion - Nasal dorsum x 2  Destruction method: cryotherapy   Informed consent: discussed and consent obtained   Lesion destroyed using liquid nitrogen: Yes   Region frozen until ice ball extended beyond lesion: Yes   Outcome:  patient tolerated procedure well with no complications   Post-procedure details: wound care instructions given   Additional details:  Cryotherapy Aftercare  Wash gently with soap and water everyday.   Apply Vaseline and Band-Aid daily until healed.    Actinic skin damage chest, face  Recommend broad spectrum SPF and photoprotection   Hand dermatitis bil hands  Minimize soap/water exposure.  Mild soap (Dove) and moisturizer after handwashing.  triamcinolone ointment (KENALOG) 0.1 % - bil hands  Hemangioma of skin chest  Benign.  Observe.   History of dysplastic nevus Mid Back, R lower leg  Clear. Observe for recurrence.  Recommend regular skin exams, sunscreen use, and photoprotection.    Inflamed seborrheic keratosis (2) L mid forearm x 1,; Right upper Flank at braline x 1  Destruction of lesion - L mid forearm x 1,, Right upper Flank at braline x 1  Destruction method: cryotherapy   Informed consent: discussed and consent obtained   Lesion destroyed using liquid nitrogen: Yes   Region frozen until ice ball extended beyond lesion: Yes   Outcome: patient tolerated procedure well with no complications   Post-procedure details: wound care instructions given    Lentigines back  Benign.  Observe.   Nevus Right Upper Back  Benign-appearing.  Observation   Seborrheic keratosis (2) Right pretibia; Back  Benign, observe.    Skin cancer screening TBSE  Spider veins bil legs  Benign, observe.    Xerosis cutis arms, legs, trunk, hands  Recommend mild soap and moisturizer qd Recommend Amlactin Rapid Relief, Cerave SA, or Eucerin roughness relief bid,  samples given.  Return in about 1 year (around 08/13/2020) for TBSE.   I, Othelia Pulling, RMA, am acting as scribe for Brendolyn Patty, MD .

## 2020-01-24 ENCOUNTER — Ambulatory Visit
Admission: RE | Admit: 2020-01-24 | Discharge: 2020-01-24 | Disposition: A | Discharge status: Home / Self Care | Payer: Medicare PPO | Source: Ambulatory Visit | Attending: Internal Medicine | Admitting: Internal Medicine

## 2020-01-24 ENCOUNTER — Other Ambulatory Visit: Payer: Self-pay

## 2020-01-24 DIAGNOSIS — Z1231 Encounter for screening mammogram for malignant neoplasm of breast: Secondary | ICD-10-CM

## 2020-03-19 ENCOUNTER — Ambulatory Visit: Payer: Medicare PPO | Admitting: Dermatology

## 2020-03-19 ENCOUNTER — Other Ambulatory Visit: Payer: Self-pay

## 2020-03-19 ENCOUNTER — Encounter: Payer: Self-pay | Admitting: Dermatology

## 2020-03-19 DIAGNOSIS — L814 Other melanin hyperpigmentation: Secondary | ICD-10-CM

## 2020-03-19 DIAGNOSIS — L570 Actinic keratosis: Secondary | ICD-10-CM | POA: Diagnosis not present

## 2020-03-19 DIAGNOSIS — S0083XA Contusion of other part of head, initial encounter: Secondary | ICD-10-CM | POA: Diagnosis not present

## 2020-03-19 DIAGNOSIS — T148XXA Other injury of unspecified body region, initial encounter: Secondary | ICD-10-CM

## 2020-03-19 DIAGNOSIS — L720 Epidermal cyst: Secondary | ICD-10-CM | POA: Diagnosis not present

## 2020-03-19 DIAGNOSIS — L578 Other skin changes due to chronic exposure to nonionizing radiation: Secondary | ICD-10-CM

## 2020-03-19 NOTE — Patient Instructions (Addendum)
Cryotherapy Aftercare  . Wash gently with soap and water everyday.   Marland Kitchen Apply Vaseline and Band-Aid daily until healed.  Wound Care Instructions  On the day following your surgery, you should begin doing daily dressing changes: Remove the old dressing and discard it. Cleanse the wound gently with tap water. This may be done in the shower or by placing a wet gauze pad directly on the wound and letting it soak for several minutes. It is important to gently remove any dried blood from the wound in order to encourage healing. This may be done by gently rolling a moistened Q-tip on the dried blood. Do not pick at the wound. If the wound should start to bleed, continue cleaning the wound, then place a moist gauze pad on the wound and hold pressure for a few minutes.  Make sure you then dry the skin surrounding the wound completely or the tape will not stick to the skin. Do not use cotton balls on the wound. After the wound is clean and dry, apply the ointment gently with a Q-tip. Cut a non-stick pad to fit the size of the wound. Lay the pad flush to the wound. If the wound is draining, you may want to reinforce it with a small amount of gauze on top of the non-stick pad for a little added compression to the area. Use the tape to seal the area completely. Select from the following with respect to your individual situation: If your wound has been stitched closed: continue the above steps 1-8 at least daily until your sutures are removed. If your wound has been left open to heal: continue steps 1-8 at least daily for the first 3-4 weeks. We would like for you to take a few extra precautions for at least the next week. Sleep with your head elevated on pillows if our wound is on your head. Do not bend over or lift heavy items to reduce the chance of elevated blood pressure to the wound Do not participate in particularly strenuous activities.   Below is a list of dressing supplies you might need.   Cotton-tipped applicators - Q-tips Gauze pads (2x2 and/or 4x4) - All-Purpose Sponges Non-stick dressing material - Telfa Tape - Paper or Hypafix New and clean tube of petroleum jelly - Vaseline    Comments on Post-Operative Period Slight swelling and redness often appear around the wound. This is normal and will disappear within several days following the surgery. The healing wound will drain a brownish-red-yellow discharge during healing. This is a normal phase of wound healing. As the wound begins to heal, the drainage may increase in amount. Again, this drainage is normal. Notify us if the drainage becomes persistently bloody, excessively swollen, or intensely painful or develops a foul odor or red streaks.  If you should experience mild discomfort during the healing phase, you may take an aspirin-free medication such as Tylenol (acetaminophen). Notify us if the discomfort is severe or persistent. Avoid alcoholic beverages when taking pain medicine.  In Case of Wound Hemorrhage A wound hemorrhage is when the bandage suddenly becomes soaked with bright red blood and flows profusely. If this happens, sit down or lie down with your head elevated. If the wound has a dressing on it, do not remove the dressing. Apply pressure to the existing gauze. If the wound is not covered, use a gauze pad to apply pressure and continue applying the pressure for 20 minutes without peeking. DO NOT COVER THE WOUND WITH A LARGE  TOWEL OR [place] CLOTH. Release your hand from the wound site but do not remove the dressing. If the bleeding has stopped, gently clean around the wound. Leave the dressing in place for 24 hours if possible. This wait time allows the blood vessels to close off so that you do not spark a new round of bleeding by disrupting the newly clotted blood vessels with an immediate dressing change. If the bleeding does not subside, continue to hold pressure. If matters are out of your control, contact an After  Hours clinic or go to the Emergency Room.   Recommend daily broad spectrum sunscreen SPF 30+ to sun-exposed areas, reapply every 2 hours as needed. Call for new or changing lesions.

## 2020-03-19 NOTE — Progress Notes (Signed)
   Follow-Up Visit   Subjective  Pamela Bowen is a 74 y.o. female who presents for the following: Skin Problem (Pt here to have areas on face examed today, pink scaly irritated areas ). Pt c/o irritated growth on the R chin for several months growing and not going away.    The following portions of the chart were reviewed this encounter and updated as appropriate:  Tobacco  Allergies  Meds  Problems  Med Hx  Surg Hx  Fam Hx      Review of Systems:  No other skin or systemic complaints except as noted in HPI or Assessment and Plan.  Objective  Well appearing patient in no apparent distress; mood and affect are within normal limits.  A focused examination was performed including face,ears,neck . Relevant physical exam findings are noted in the Assessment and Plan.  Objective  nose, R temple, L forehead (3): Erythematous thin papules/macules with gritty scale.   Objective  Left cheek: Violaceous macule  Objective  R chin: White subcutaneous papules   Assessment & Plan  AK (actinic keratosis) (3) nose, R temple, L forehead  Prior to procedure, discussed risks of blister formation, small wound, skin dyspigmentation, or rare scar following cryotherapy.    Destruction of lesion - nose, R temple, L forehead Complexity: simple   Destruction method: cryotherapy   Informed consent: discussed and consent obtained   Timeout:  patient name, date of birth, surgical site, and procedure verified Lesion destroyed using liquid nitrogen: Yes   Region frozen until ice ball extended beyond lesion: Yes   Outcome: patient tolerated procedure well with no complications   Post-procedure details: wound care instructions given    Bruise Left cheek  Recommend otc dermend bruise cream daily  Recheck if not resolved  Milia R chin  Medial milia not bothersome today Lateral milia symptomatic   Acne/Milia surgery - R chin Location: Right chin   Informed Consent: Discussed risks  (permanent scarring, light or dark discoloration, infection, pain, bleeding, bruising, redness, damage to adjacent structures, and recurrence of the lesion) and benefits of the procedure, as well as the alternatives.  Informed consent was obtained.  Preparation: The area was prepped with alcohol.  Anesthesia: Lidocaine 1% without epinephrine  Procedure Details: An incision was made overlying the lesion. The lesion drained chalky material  A small amount of material was drained.    Antibiotic ointment and a sterile pressure dressing were applied. The patient tolerated procedure well.  Total number of lesions drained: 1  Plan: The patient was instructed on post-op care. Recommend OTC analgesia as needed for pain.  Lentigines - Scattered tan macules - Discussed due to sun exposure - Benign, observe - Call for any changes  Actinic Damage - Chronic, not at goal - diffuse scaly erythematous macules with underlying dyspigmentation - Discussed therapy with 5-FU/calcipotriene and expected reaction to treat progressive actinic damage at nose - She defers at this time and we will monitor - Recommend daily broad spectrum sunscreen SPF 30+ to sun-exposed areas, reapply every 2 hours as needed.  - Call for new or changing lesions.   Return in about 3 months (around 06/19/2020) for TBSE, AKs .  I, Marye Round, CMA, am acting as scribe for Forest Gleason, MD .  Documentation: I have reviewed the above documentation for accuracy and completeness, and I agree with the above.  Forest Gleason, MD

## 2020-05-07 ENCOUNTER — Other Ambulatory Visit (HOSPITAL_COMMUNITY): Payer: Self-pay | Admitting: Sports Medicine

## 2020-05-07 ENCOUNTER — Other Ambulatory Visit: Payer: Self-pay | Admitting: Sports Medicine

## 2020-05-07 DIAGNOSIS — M25552 Pain in left hip: Secondary | ICD-10-CM

## 2020-05-07 DIAGNOSIS — M76892 Other specified enthesopathies of left lower limb, excluding foot: Secondary | ICD-10-CM

## 2020-05-07 DIAGNOSIS — M7062 Trochanteric bursitis, left hip: Secondary | ICD-10-CM

## 2020-05-12 ENCOUNTER — Other Ambulatory Visit: Payer: Self-pay

## 2020-05-12 ENCOUNTER — Ambulatory Visit
Admission: RE | Admit: 2020-05-12 | Discharge: 2020-05-12 | Disposition: A | Discharge status: Home / Self Care | Payer: Medicare PPO | Source: Ambulatory Visit | Attending: Sports Medicine | Admitting: Sports Medicine

## 2020-05-12 DIAGNOSIS — M76892 Other specified enthesopathies of left lower limb, excluding foot: Secondary | ICD-10-CM | POA: Insufficient documentation

## 2020-05-12 DIAGNOSIS — M7062 Trochanteric bursitis, left hip: Secondary | ICD-10-CM | POA: Diagnosis present

## 2020-05-12 DIAGNOSIS — M25552 Pain in left hip: Secondary | ICD-10-CM

## 2020-06-20 ENCOUNTER — Encounter: Payer: Medicare PPO | Admitting: Dermatology

## 2020-08-08 ENCOUNTER — Ambulatory Visit: Payer: Medicare PPO | Admitting: Dermatology

## 2020-08-20 ENCOUNTER — Encounter: Payer: Medicare PPO | Admitting: Dermatology

## 2020-08-23 ENCOUNTER — Other Ambulatory Visit: Payer: Self-pay | Admitting: Internal Medicine

## 2020-08-23 DIAGNOSIS — Z1231 Encounter for screening mammogram for malignant neoplasm of breast: Secondary | ICD-10-CM

## 2020-09-24 ENCOUNTER — Other Ambulatory Visit: Payer: Self-pay | Admitting: Orthopedic Surgery

## 2020-10-10 ENCOUNTER — Other Ambulatory Visit: Payer: Self-pay | Admitting: Orthopedic Surgery

## 2020-10-10 DIAGNOSIS — S76012A Strain of muscle, fascia and tendon of left hip, initial encounter: Secondary | ICD-10-CM

## 2020-10-11 ENCOUNTER — Other Ambulatory Visit: Payer: Self-pay

## 2020-10-11 ENCOUNTER — Ambulatory Visit
Admission: RE | Admit: 2020-10-11 | Discharge: 2020-10-11 | Disposition: A | Discharge status: Home / Self Care | Payer: Medicare PPO | Source: Ambulatory Visit | Attending: Orthopedic Surgery | Admitting: Orthopedic Surgery

## 2020-10-11 DIAGNOSIS — S76012A Strain of muscle, fascia and tendon of left hip, initial encounter: Secondary | ICD-10-CM

## 2020-10-17 ENCOUNTER — Ambulatory Visit: Payer: Medicare PPO | Admitting: Dermatology

## 2020-10-17 ENCOUNTER — Other Ambulatory Visit: Payer: Self-pay

## 2020-10-17 DIAGNOSIS — Z1283 Encounter for screening for malignant neoplasm of skin: Secondary | ICD-10-CM | POA: Diagnosis not present

## 2020-10-17 DIAGNOSIS — D485 Neoplasm of uncertain behavior of skin: Secondary | ICD-10-CM

## 2020-10-17 DIAGNOSIS — Z872 Personal history of diseases of the skin and subcutaneous tissue: Secondary | ICD-10-CM

## 2020-10-17 DIAGNOSIS — L570 Actinic keratosis: Secondary | ICD-10-CM

## 2020-10-17 DIAGNOSIS — L814 Other melanin hyperpigmentation: Secondary | ICD-10-CM

## 2020-10-17 DIAGNOSIS — Z86018 Personal history of other benign neoplasm: Secondary | ICD-10-CM

## 2020-10-17 DIAGNOSIS — D229 Melanocytic nevi, unspecified: Secondary | ICD-10-CM

## 2020-10-17 DIAGNOSIS — L578 Other skin changes due to chronic exposure to nonionizing radiation: Secondary | ICD-10-CM

## 2020-10-17 DIAGNOSIS — D1800 Hemangioma unspecified site: Secondary | ICD-10-CM

## 2020-10-17 DIAGNOSIS — L820 Inflamed seborrheic keratosis: Secondary | ICD-10-CM

## 2020-10-17 DIAGNOSIS — L821 Other seborrheic keratosis: Secondary | ICD-10-CM

## 2020-10-17 HISTORY — DX: Actinic keratosis: L57.0

## 2020-10-17 NOTE — Patient Instructions (Addendum)
Cryotherapy Aftercare  . Wash gently with soap and water everyday.   Marland Kitchen Apply Vaseline and Band-Aid daily until healed.  Prior to procedure, discussed risks of blister formation, small wound, skin dyspigmentation, or rare scar following cryotherapy.    Melanoma ABCDEs  Melanoma is the most dangerous type of skin cancer, and is the leading cause of death from skin disease.  You are more likely to develop melanoma if you:  Have light-colored skin, light-colored eyes, or red or blond hair  Spend a lot of time in the sun  Tan regularly, either outdoors or in a tanning bed  Have had blistering sunburns, especially during childhood  Have a close family member who has had a melanoma  Have atypical moles or large birthmarks  Early detection of melanoma is key since treatment is typically straightforward and cure rates are extremely high if we catch it early.   The first sign of melanoma is often a change in a mole or a new dark spot.  The ABCDE system is a way of remembering the signs of melanoma.  A for asymmetry:  The two halves do not match. B for border:  The edges of the growth are irregular. C for color:  A mixture of colors are present instead of an even brown color. D for diameter:  Melanomas are usually (but not always) greater than 31mm - the size of a pencil eraser. E for evolution:  The spot keeps changing in size, shape, and color.  Please check your skin once per month between visits. You can use a small mirror in front and a large mirror behind you to keep an eye on the back side or your body.   If you see any new or changing lesions before your next follow-up, please call to schedule a visit.  Please continue daily skin protection including broad spectrum sunscreen SPF 30+ to sun-exposed areas, reapplying every 2 hours as needed when you're outdoors.   If you have any questions or concerns for your doctor, please call our main line at [phone] and press option 4 to  reach your doctor's medical assistant. If no one answers, please leave a voicemail as directed and we will return your call as soon as possible. Messages left after 4 pm will be answered the following business day.   You may also send [place] a message via [facility]. We typically respond to MyChart messages within 1-2 business days.  For prescription refills, please ask your pharmacy to contact our office. Our fax number is [phone].  If you have an urgent issue when the clinic is closed that cannot wait until the next business day, you can page your doctor at the number below.    Please note that while we do our best to be available for urgent issues outside of office hours, we are not available 24/7.   If you have an urgent issue and are unable to reach [place], you may choose to seek medical care at your doctor's office, retail clinic, urgent care center, or emergency room.  If you have a medical emergency, please immediately call 911 or go to the emergency department.  Pager Numbers  - Dr. Nehemiah Massed: [phone]  - Dr. Laurence Ferrari: [phone]  - Dr. Nicole Kindred: [phone]  In the event of inclement weather, please call our main line at [phone] for an update on the status of any delays or closures.  Dermatology Medication Tips: Please keep the boxes that topical medications come in in order to help  keep track of the instructions about where and how to use these. Pharmacies typically print the medication instructions only on the boxes and not directly on the medication tubes.   If your medication is too expensive, please contact our office at [phone] option 4 or send [place] a message through [facility].   We are unable to tell what your co-pay for medications will be in advance as this is different depending on your insurance coverage. However, we may be able to find a substitute medication at lower cost or fill out paperwork to get insurance to cover a needed medication.   If a prior  authorization is required to get your medication covered by your insurance company, please allow [place] 1-2 business days to complete this process.  Drug prices often vary depending on where the prescription is filled and some pharmacies may offer cheaper prices.  The website www.goodrx.com contains coupons for medications through different pharmacies. The prices here do not account for what the cost may be with help from insurance (it may be cheaper with your insurance), but the website can give you the price if you did not use any insurance.  - You can print the associated coupon and take it with your prescription to the pharmacy.  - You may also stop by our office during regular business hours and pick up a GoodRx coupon card.  - If you need your prescription sent electronically to a different pharmacy, notify our office through [facility] or by phone at [phone] option 4.   Wound Care Instructions  1. Cleanse wound gently with soap and water once a day then pat dry with clean gauze. Apply a thing coat of Petrolatum (petroleum jelly, "Vaseline") over the wound (unless you have an allergy to this). We recommend that you use a new, sterile tube of Vaseline. Do not pick or remove scabs. Do not remove the yellow or white "healing tissue" from the base of the wound.  2. Cover the wound with fresh, clean, nonstick gauze and secure with paper tape. You may use Band-Aids in place of gauze and tape if the would is small enough, but would recommend trimming much of the tape off as there is often too much. Sometimes Band-Aids can irritate the skin.  3. You should call the office for your biopsy report after 1 week if you have not already been contacted.  4. If you experience any problems, such as abnormal amounts of bleeding, swelling, significant bruising, significant pain, or evidence of infection, please call the office immediately.  5. FOR ADULT SURGERY PATIENTS: If you need something for  pain relief you may take 1 extra strength Tylenol (acetaminophen) AND 2 Ibuprofen (200mg  each) together every 4 hours as needed for pain. (do not take these if you are allergic to them or if you have a reason you should not take them.) Typically, you may only need pain medication for 1 to 3 days.

## 2020-10-17 NOTE — Progress Notes (Signed)
Follow-Up Visit   Subjective  Pamela Bowen is a 75 y.o. female who presents for the following: FBSE (Patient here for full body skin exam and skin cancer screening. Patient with hx of dysplastic nevi and AK's. She has a few spots she would like checked at her legs, right axilla and back that sometimes get irritated and feel like they have a scab on them.  ).    The following portions of the chart were reviewed this encounter and updated as appropriate:   Tobacco  Allergies  Meds  Problems  Med Hx  Surg Hx  Fam Hx      Review of Systems:  No other skin or systemic complaints except as noted in HPI or Assessment and Plan.  Objective  Well appearing patient in no apparent distress; mood and affect are within normal limits.  A full examination was performed including scalp, head, eyes, ears, nose, lips, neck, chest, axillae, abdomen, back, buttocks, bilateral upper extremities, bilateral lower extremities, hands, feet, fingers, toes, fingernails, and toenails. All findings within normal limits unless otherwise noted below.  Objective  Right posterior calf x 1: Erythematous keratotic or waxy stuck-on papule or plaque.   Objective  Right Temple x 1: Erythematous thin papules/macules with gritty scale.   Objective  Right zygoma: 0.9cm thin scaly pink plaque      Assessment & Plan  Inflamed seborrheic keratosis Right posterior calf x 1  Prior to procedure, discussed risks of blister formation, small wound, skin dyspigmentation, or rare scar following cryotherapy.    Destruction of lesion - Right posterior calf x 1  Destruction method: cryotherapy   Informed consent: discussed and consent obtained   Lesion destroyed using liquid nitrogen: Yes   Cryotherapy cycles:  2 Outcome: patient tolerated procedure well with no complications   Post-procedure details: wound care instructions given    AK (actinic keratosis) Right Temple x 1  Prior to procedure, discussed  risks of blister formation, small wound, skin dyspigmentation, or rare scar following cryotherapy.     Destruction of lesion - Right Temple x 1  Destruction method: cryotherapy   Informed consent: discussed and consent obtained   Lesion destroyed using liquid nitrogen: Yes   Cryotherapy cycles:  2 Outcome: patient tolerated procedure well with no complications   Post-procedure details: wound care instructions given    Neoplasm of uncertain behavior of skin Right zygoma  Skin / nail biopsy Type of biopsy: tangential   Informed consent: discussed and consent obtained   Timeout: patient name, date of birth, surgical site, and procedure verified   Patient was prepped and draped in usual sterile fashion: Area prepped with isopropyl alcohol. Anesthesia: the lesion was anesthetized in a standard fashion   Anesthetic:  1% lidocaine w/ epinephrine 1-100,000 buffered w/ 8.4% NaHCO3 Instrument used: flexible razor blade   Hemostasis achieved with: aluminum chloride   Outcome: patient tolerated procedure well   Post-procedure details: wound care instructions given   Additional details:  Mupirocin and a bandage applied  Specimen 1 - Surgical pathology Differential Diagnosis: r/o BCC  Check Margins: No 0.9cm thin scaly pink plaque   Lentigines - Scattered tan macules - Due to sun exposure - Benign-appering, observe - Recommend daily broad spectrum sunscreen SPF 30+ to sun-exposed areas, reapply every 2 hours as needed. - Call for any changes  Seborrheic Keratoses - Stuck-on, waxy, tan-brown papules and/or plaques  - Benign-appearing - Discussed benign etiology and prognosis. - Observe - Call for any changes  Melanocytic Nevi - Tan-brown and/or pink-flesh-colored symmetric macules and papules - Benign appearing on exam today - Observation - Call clinic for new or changing moles - Recommend daily use of broad spectrum spf 30+ sunscreen to sun-exposed areas.   Hemangiomas -  Red papules - Discussed benign nature - Observe - Call for any changes  Actinic Damage - Chronic condition, secondary to cumulative UV/sun exposure - diffuse scaly erythematous macules with underlying dyspigmentation - Recommend daily broad spectrum sunscreen SPF 30+ to sun-exposed areas, reapply every 2 hours as needed.  - Staying in the shade or wearing long sleeves, sun glasses (UVA+UVB protection) and wide brim hats (4-inch brim around the entire circumference of the hat) are also recommended for sun protection.  - Call for new or changing lesions.  Skin cancer screening performed today.  History of Dysplastic Nevi - No evidence of recurrence today - Recommend regular full body skin exams - Recommend daily broad spectrum sunscreen SPF 30+ to sun-exposed areas, reapply every 2 hours as needed.  - Call if any new or changing lesions are noted between office visits  History of PreCancerous Actinic Keratosis  - site(s) of PreCancerous Actinic Keratosis clear today. - these may recur and new lesions may form requiring treatment to prevent transformation into skin cancer - observe for new or changing spots and contact [facility] for appointment if occur - photoprotection with sun protective clothing; sunglasses and broad spectrum sunscreen with SPF of at least 30 + and frequent self skin exams recommended - yearly exams by a dermatologist recommended for persons with history of PreCancerous Actinic Keratoses  Return for 1 year FBSE.  [name], RMA, am acting as scribe for Forest Gleason, MD .  Documentation: I have reviewed the above documentation for accuracy and completeness, and I agree with the above.  Forest Gleason, MD

## 2020-10-21 ENCOUNTER — Encounter: Payer: Self-pay | Admitting: Dermatology

## 2020-10-24 ENCOUNTER — Telehealth: Payer: Self-pay

## 2020-10-24 NOTE — Telephone Encounter (Signed)
Called and discussed biopsy results with patient. She verbalized understanding and would like to have area treated with Ln2. Patient states she is having surgery soon and will be unavailable to come in after that for another 6 weeks. Scheduled patient for follow up in September to have spot at right zygoma treated. She requested to please cover her eyes when treating.

## 2020-10-24 NOTE — Telephone Encounter (Signed)
-----   Message from Alfonso Patten, MD sent at 10/24/2020  5:34 PM EDT ----- Skin , Right zygoma ACTINIC KERATOSIS  This is a precancerous spot. A small portion of these spots will turn into squamous cell skin cancer with time.  However, we are not sure which spots will progress to skin cancer and which ones will stay the same so we typically recommend treating them.  For this reason, I would recommend that we treat it with liquid nitrogen in clinic.  MAs please call. Thank you!

## 2020-10-30 ENCOUNTER — Ambulatory Visit: Payer: Medicare PPO | Admitting: Dermatology

## 2020-10-31 ENCOUNTER — Encounter: Payer: Self-pay | Admitting: Orthopedic Surgery

## 2020-11-07 NOTE — Discharge Instructions (Addendum)
Gluteus Medius Repair Post-Operative Instructions   1. Plan for 1st Physical Therapy visit in the next week. 2. If oozing from surgery site occurs after 5-7 days postoperative, please contact Dr. Serita Grit offices. 3. Icing is very important for the first 5-7 days postoperative, and ice is applied (ice packs or ice therapy) as often as possible or at least for 20-minute periods 3-4 times per day. Ice should not be applied directly on the skin. 4. You may remove the dressing after first PT visit or on post-op day #5 5. Showering is allowed on post-op day #5 if the wound is dry.  6.  Do not soak the hip in water in a bathtub or pool for ~4 weeks.  7. Driving is permitted after ~2 weeks if the narcotic pain medication is no longer being taken and you feel comfortable getting into and out of a car. Driving a manual car may take up to 4 weeks. 8. The anesthetic drugs used during your surgery may cause nausea for the first 24 hours. If nausea is encountered, drink only clear liquids (i.e. Sprite or 7-up). The only solids should be dry crackers or toast. If nausea and vomiting become severe or the patient shows sign of dehydration (lack of urination) please call the doctor or the surgicenter. 9. If you develop a fever (101.5), redness, or yellow/brown/green drainage from the surgical incision site, please call our office to arrange for an evaluation. 10. Enclosed are prescriptions for you to use post-operatively. 11. You will take aspirin (325 mg) daily for 2 weeks. This may lower the risk of a blood clot developing after surgery. Should severe calf pain occur or significant swelling of calf and ankle, please call the doctor. 12. Local anesthetics (i.e. Novocaine) are put into the incision after surgery. It is not uncommon for patients to encounter more pain on the first or second day after surgery. This is the time when swelling peaks. Taking pain medication before bedtime will assist in sleeping. It is  important not to drink or drive while taking narcotic medication.  You should resume your normal medications for other conditions the day after surgery. 13. Follow weight bearing instructions (Flat foot weight bearing) as advised at discharge. Crutches Gilford Rile may be necessary to assist walking. Extremity elevation for the first 72 hours is also encouraged to minimize swelling. 14.  If unexpected problems occur and you need to speak to the doctor, call the office.  ACTIVITY RESTRICTIONS AND BRACING: -Brace should be worn at night while sleeping and locked in extension (0 degrees). Push orange tab down to lock in extension. If it will not push down, then the brace is probably not completely extended and may still be a bit bent.  -When up and ambulating, your brace can be unlocked from 0-90 degrees while following your touch-down weightbearing restrictions with crutches -Avoid sitting with your hips flexed 90 degrees for more than 30 minutes at a time -Avoid excessive external rotation or the "Figure-4" position with your leg crossed -We will updated your restrictions at your first postoperative visit   PAIN MEDICATION:  Tylenol (acetaminophen) 1000mg  3x/day until no pain 2.   Norco (hydrocodone 5mg /acetaminophen 325mg ) 1 to 2 tablets by mouth every 4 hours as needed. If taking Norco, reduce the amount of plain tylenol you take so that you do not exceed 4000mg /day of acetaminophen. 3.   Do NOT take anti-inflammatory medications as they can interfere with healing. These include ibuprofen (Advil, Motrin), naproxen (Aleve), meloxicam (Mobic), etodolac,  diclofenac, etc.  BLOOD CLOT PREVENTION: Aspirin 325mg  by mouth daily for 2 weeks  ANTI-NAUSEA (if applicable): Zofran 4mg  tabs, 1 tablet by mouth every 6 hours as needed. *You will be given a prescription, but it is optional to fill it.*  STOOL SOFTENER: Docusate 100mg  twice daily while taking pain medication   Important Contact  Information [phone] (Office phone number)   Post-operative Brace: Apply and remove the brace you received as you were instructed to at the time of fitting and as described in detail as the brace's instructions for use indicate.  Wear the brace for the period of time prescribed by your physician.  The brace can be cleaned with soap and water and allowed to air dry only.  Should the brace result in increased pain, decreased feeling (numbness/tingling), increased swelling or an overall worsening of your medical condition, please contact your doctor immediately.  If an emergency situation occurs as a result of wearing the brace after normal business hours, please dial 911 and seek immediate medical attention.  Let your doctor know if you have any further questions about the brace issued to you. Refer to the T-Scope Hip instructions for use if you have any questions regarding your hip brace.  [place] [facility]: [phone] Wrap brace around waist and attach with velcro.  Pull back support with thumb loop and adhere to waist belt.  3.   Clip on thigh strap and  tighten with strapping on front of thigh as needed.

## 2020-11-14 NOTE — Anesthesia Preprocedure Evaluation (Addendum)
Anesthesia Evaluation  Patient identified by MRN, date of birth, ID band Patient awake    Reviewed: NPO status   History of Anesthesia Complications (+) PROLONGED EMERGENCE and history of anesthetic complications  Airway Mallampati: II  TM Distance: >3 FB Neck ROM: full    Dental no notable dental hx.    Pulmonary asthma (moderate, seasonal.) , former smoker,    Pulmonary exam normal        Cardiovascular Exercise Tolerance: Good hypertension, Normal cardiovascular exam     Neuro/Psych Anxiety negative neurological ROS     GI/Hepatic negative GI ROS, Neg liver ROS,   Endo/Other  Hypothyroidism Morbid obesity (bmi 30)  Renal/GU negative Renal ROS  negative genitourinary   Musculoskeletal  (+) Arthritis , Osteoarthritis,    Abdominal   Peds  Hematology negative hematology ROS (+)   Anesthesia Other Findings Pcp: Cheryll Cockayne, MD - 10/24/2020   Reproductive/Obstetrics                            Anesthesia Physical Anesthesia Plan  ASA: 2  Anesthesia Plan: General ETT and General LMA   Post-op Pain Management:    Induction:   PONV Risk Score and Plan: 2 and Midazolam and Ondansetron  Airway Management Planned:   Additional Equipment:   Intra-op Plan:   Post-operative Plan:   Informed Consent: I have reviewed the patients History and Physical, chart, labs and discussed the procedure including the risks, benefits and alternatives for the proposed anesthesia with the patient or authorized representative who has indicated his/her understanding and acceptance.       Plan Discussed with: CRNA  Anesthesia Plan Comments:         Anesthesia Quick Evaluation

## 2020-11-15 ENCOUNTER — Other Ambulatory Visit: Payer: Self-pay

## 2020-11-15 ENCOUNTER — Encounter
Admission: RE | Disposition: A | Discharge status: Home / Self Care | Payer: Self-pay | Source: Home / Self Care | Attending: Orthopedic Surgery

## 2020-11-15 ENCOUNTER — Ambulatory Visit
Admission: RE | Admit: 2020-11-15 | Discharge: 2020-11-15 | Disposition: A | Discharge status: Home / Self Care | Payer: Medicare PPO | Attending: Orthopedic Surgery | Admitting: Orthopedic Surgery

## 2020-11-15 ENCOUNTER — Ambulatory Visit: Payer: Medicare PPO | Admitting: Anesthesiology

## 2020-11-15 ENCOUNTER — Encounter: Payer: Self-pay | Admitting: Orthopedic Surgery

## 2020-11-15 DIAGNOSIS — Z79899 Other long term (current) drug therapy: Secondary | ICD-10-CM | POA: Insufficient documentation

## 2020-11-15 DIAGNOSIS — M7062 Trochanteric bursitis, left hip: Secondary | ICD-10-CM | POA: Insufficient documentation

## 2020-11-15 DIAGNOSIS — S76012A Strain of muscle, fascia and tendon of left hip, initial encounter: Secondary | ICD-10-CM | POA: Insufficient documentation

## 2020-11-15 DIAGNOSIS — Z885 Allergy status to narcotic agent status: Secondary | ICD-10-CM | POA: Diagnosis not present

## 2020-11-15 DIAGNOSIS — Z79890 Hormone replacement therapy (postmenopausal): Secondary | ICD-10-CM | POA: Diagnosis not present

## 2020-11-15 DIAGNOSIS — Z87891 Personal history of nicotine dependence: Secondary | ICD-10-CM | POA: Diagnosis not present

## 2020-11-15 DIAGNOSIS — Z888 Allergy status to other drugs, medicaments and biological substances status: Secondary | ICD-10-CM | POA: Insufficient documentation

## 2020-11-15 DIAGNOSIS — X58XXXA Exposure to other specified factors, initial encounter: Secondary | ICD-10-CM | POA: Diagnosis not present

## 2020-11-15 HISTORY — PX: GLUTEUS MINIMUS REPAIR: SHX5843

## 2020-11-15 HISTORY — DX: Other complications of anesthesia, initial encounter: T88.59XA

## 2020-11-15 SURGERY — REPAIR, TENDON, GLUTEUS MINIMUS
Anesthesia: General | Site: Hip | Laterality: Left

## 2020-11-15 MED ORDER — DEXAMETHASONE SODIUM PHOSPHATE 4 MG/ML IJ SOLN
INTRAMUSCULAR | Status: DC | PRN
  Administered 2020-11-15: 4 mg via INTRAVENOUS

## 2020-11-15 MED ORDER — FENTANYL CITRATE (PF) 100 MCG/2ML IJ SOLN
INTRAMUSCULAR | Status: DC | PRN
  Administered 2020-11-15: 25 ug via INTRAVENOUS
  Administered 2020-11-15: 50 ug via INTRAVENOUS
  Administered 2020-11-15: 25 ug via INTRAVENOUS

## 2020-11-15 MED ORDER — ROCURONIUM BROMIDE 100 MG/10ML IV SOLN
INTRAVENOUS | Status: DC | PRN
  Administered 2020-11-15: 10 mg via INTRAVENOUS
  Administered 2020-11-15: 5 mg via INTRAVENOUS

## 2020-11-15 MED ORDER — CEFAZOLIN SODIUM-DEXTROSE 2-4 GM/100ML-% IV SOLN
2.0000 g | INTRAVENOUS | Status: AC
  Administered 2020-11-15: 2 g via INTRAVENOUS

## 2020-11-15 MED ORDER — LACTATED RINGERS IV SOLN: INTRAVENOUS | Status: DC

## 2020-11-15 MED ORDER — OXYCODONE HCL 5 MG/5ML PO SOLN: 5.0000 mg | Freq: Once | ORAL | Status: DC | PRN

## 2020-11-15 MED ORDER — 0.9 % SODIUM CHLORIDE (POUR BTL) OPTIME
TOPICAL | Status: DC | PRN
  Administered 2020-11-15: 120 mL

## 2020-11-15 MED ORDER — ALBUTEROL SULFATE HFA 108 (90 BASE) MCG/ACT IN AERS
INHALATION_SPRAY | RESPIRATORY_TRACT | Status: DC | PRN
  Administered 2020-11-15: 4 via RESPIRATORY_TRACT

## 2020-11-15 MED ORDER — SUCCINYLCHOLINE CHLORIDE 20 MG/ML IJ SOLN
INTRAMUSCULAR | Status: DC | PRN
  Administered 2020-11-15: 120 mg via INTRAVENOUS

## 2020-11-15 MED ORDER — ONDANSETRON HCL 4 MG/2ML IJ SOLN
4.0000 mg | Freq: Once | INTRAMUSCULAR | Status: AC
  Administered 2020-11-15: 4 mg via INTRAVENOUS

## 2020-11-15 MED ORDER — MIDAZOLAM HCL 5 MG/5ML IJ SOLN
INTRAMUSCULAR | Status: DC | PRN
  Administered 2020-11-15: 2 mg via INTRAVENOUS

## 2020-11-15 MED ORDER — PROPOFOL 10 MG/ML IV BOLUS
INTRAVENOUS | Status: DC | PRN
  Administered 2020-11-15: 100 mg via INTRAVENOUS

## 2020-11-15 MED ORDER — SUGAMMADEX SODIUM 200 MG/2ML IV SOLN
INTRAVENOUS | Status: DC | PRN
  Administered 2020-11-15: 160 mg via INTRAVENOUS

## 2020-11-15 MED ORDER — BUPIVACAINE LIPOSOME 1.3 % IJ SUSP
INTRAMUSCULAR | Status: DC | PRN
  Administered 2020-11-15: 20 mL

## 2020-11-15 MED ORDER — GLYCOPYRROLATE 0.2 MG/ML IJ SOLN
INTRAMUSCULAR | Status: DC | PRN
  Administered 2020-11-15: .1 mg via INTRAVENOUS

## 2020-11-15 MED ORDER — LIDOCAINE HCL (CARDIAC) PF 100 MG/5ML IV SOSY
PREFILLED_SYRINGE | INTRAVENOUS | Status: DC | PRN
  Administered 2020-11-15: 60 mg via INTRAVENOUS

## 2020-11-15 MED ORDER — ACETAMINOPHEN 10 MG/ML IV SOLN
INTRAVENOUS | Status: DC | PRN
  Administered 2020-11-15: 1000 mg via INTRAVENOUS

## 2020-11-15 MED ORDER — ALBUTEROL SULFATE (2.5 MG/3ML) 0.083% IN NEBU: 2.5000 mg | INHALATION_SOLUTION | Freq: Once | RESPIRATORY_TRACT | Status: DC

## 2020-11-15 MED ORDER — KETOROLAC TROMETHAMINE 15 MG/ML IJ SOLN
INTRAMUSCULAR | Status: DC | PRN
  Administered 2020-11-15: 15 mg via INTRAVENOUS

## 2020-11-15 MED ORDER — ONDANSETRON HCL 4 MG/2ML IJ SOLN
INTRAMUSCULAR | Status: DC | PRN
  Administered 2020-11-15: 4 mg via INTRAVENOUS

## 2020-11-15 MED ORDER — OXYCODONE HCL 5 MG PO TABS: 5.0000 mg | ORAL_TABLET | Freq: Once | ORAL | Status: DC | PRN

## 2020-11-15 MED ORDER — PHENYLEPHRINE HCL (PRESSORS) 10 MG/ML IV SOLN
INTRAVENOUS | Status: DC | PRN
  Administered 2020-11-15: 50 ug via INTRAVENOUS
  Administered 2020-11-15 (×6): 100 ug via INTRAVENOUS

## 2020-11-15 MED ORDER — BUPIVACAINE HCL (PF) 0.5 % IJ SOLN
INTRAMUSCULAR | Status: DC | PRN
  Administered 2020-11-15: 10 mL

## 2020-11-15 SURGICAL SUPPLY — 41 items
ADH SKN CLS APL DERMABOND .7 (GAUZE/BANDAGES/DRESSINGS) ×1
APL PRP STRL LF DISP 70% ISPRP (MISCELLANEOUS) ×1
BLADE BOVIE TIP EXT 4 (BLADE) ×1 IMPLANT
CANISTER SUCT 1200ML W/VALVE (MISCELLANEOUS) ×2 IMPLANT
CHLORAPREP W/TINT 26 (MISCELLANEOUS) ×2 IMPLANT
COVER WAND RF STERILE (DRAPES) ×2 IMPLANT
DERMABOND ADVANCED (GAUZE/BANDAGES/DRESSINGS) ×1
DERMABOND ADVANCED .7 DNX12 (GAUZE/BANDAGES/DRESSINGS) ×1 IMPLANT
DRAPE INCISE IOBAN 66X45 STRL (DRAPES) ×1 IMPLANT
DRAPE SURG 17X11 SM STRL (DRAPES) ×2 IMPLANT
ELECT REM PT RETURN 9FT ADLT (ELECTROSURGICAL) ×2
ELECTRODE REM PT RTRN 9FT ADLT (ELECTROSURGICAL) ×1 IMPLANT
GLOVE SURG ENC TEXT LTX SZ7.5 (GLOVE) ×4 IMPLANT
GLOVE SURG UNDER LTX SZ8 (GLOVE) ×3 IMPLANT
GOWN SRG XL LVL 3 NONREINFORCE (GOWNS) ×1 IMPLANT
GOWN STRL NON-REIN TWL XL LVL3 (GOWNS) ×2
GOWN STRL REIN 2XL XLG LVL4 (GOWN DISPOSABLE) ×1 IMPLANT
GOWN STRL REUS W/ TWL LRG LVL3 (GOWN DISPOSABLE) ×2 IMPLANT
GOWN STRL REUS W/TWL LRG LVL3 (GOWN DISPOSABLE) ×4
KIT TURNOVER KIT A (KITS) ×2 IMPLANT
NDL HYPO 21X1.5 SAFETY (NEEDLE) IMPLANT
NDL MAYO CATGUT SZ 2 (NEEDLE) IMPLANT
NEEDLE HYPO 21X1.5 SAFETY (NEEDLE) ×2 IMPLANT
NEEDLE MAYO CATGUT SZ 1.5 (NEEDLE) ×2
NEEDLE MAYO CATGUT SZ 2 (NEEDLE) ×1 IMPLANT
PACK HIP PROSTHESIS (MISCELLANEOUS) ×2 IMPLANT
PENCIL SMOKE EVACUATOR (MISCELLANEOUS) ×1 IMPLANT
SLEEVE SUCTION 125 (MISCELLANEOUS) ×1 IMPLANT
SUT MNCRL 4-0 (SUTURE) ×2
SUT MNCRL 4-0 27XMFL (SUTURE) ×1
SUT VIC AB 0 CT1 36 (SUTURE) ×3 IMPLANT
SUT VIC AB 2-0 CT1 27 (SUTURE) ×4
SUT VIC AB 2-0 CT1 TAPERPNT 27 (SUTURE) ×1 IMPLANT
SUTURE MNCRL 4-0 27XMF (SUTURE) IMPLANT
SUTURETAPE 1.3 40 W/NDL BLUE (SUTURE) ×4 IMPLANT
SYR 30ML LL (SYRINGE) ×2 IMPLANT
SYS INTERNAL BRACE KNEE (Miscellaneous) ×2 IMPLANT
SYSTEM INTERNAL BRACE KNEE (Miscellaneous) IMPLANT
TAPE MICROFOAM 4IN (TAPE) ×2 IMPLANT
breg pad multi xl wo ×1 IMPLANT
opsite post op visible ×1 IMPLANT

## 2020-11-15 NOTE — Anesthesia Postprocedure Evaluation (Signed)
Anesthesia Post Note  Patient: Pamela Bowen  Procedure(s) Performed: Left open gluteus medius repair with trochanteric bursectomy (Left: Hip)     Patient location during evaluation: PACU Anesthesia Type: General Level of consciousness: awake and alert Pain management: pain level controlled Vital Signs Assessment: post-procedure vital signs reviewed and stable Respiratory status: spontaneous breathing, nonlabored ventilation, respiratory function stable and patient connected to nasal cannula oxygen Cardiovascular status: blood pressure returned to baseline and stable Postop Assessment: no apparent nausea or vomiting Anesthetic complications: no   No notable events documented.  Fidel Levy

## 2020-11-15 NOTE — Op Note (Signed)
DATE OF SURGERY:  11/15/2020   PREOPERATIVE DIAGNOSIS:  1.  Left hip partial-thickness gluteus medius tear 2.  Left hip trochanteric bursitis   POSTOPERATIVE DIAGNOSIS:  1.  Left hip partial-thickness gluteus medius tear 2.  Left hip trochanteric bursitis   PROCEDURE:  1.  Left hip open gluteus medius repair  2.  Left hip open trochanteric bursectomy  SURGEON: Cato Mulligan, MD   ASSISTANTS: Anitra Lauth, PA   EBL: 25cc  ANESTHESIA: Gen  IMPLANTS: -Arthrex 4.75 mm SwiveLock anchor x2  INDICATION(S): The patient is a 75 y.o. female who presents with persistent hip pain and gait disturbance. The MRI revealed a partial-thickness gluteus medius tear.  The patient has failed all non-operative care to date including multiple corticosteroid injections, physical therapy and activity modification.  Please see the preoperative notes for further detail. The patient elected to undergo the above mentioned procedure after detailed explanation of the expected outcomes and recovery path.   Informed consent was obtained outlining the expected benefits and possible risks of the surgery highlighted by failure of the repair to heal or to retear. Other risks include continued pain and other general risks of surgery such as blood clots, infection, bleeding and nerve injury.  OPERATIVE FINDINGS: Partial-thickness undersurface tear of the gluteus medius involving the lateral facet attachment.  Significant trochanteric bursitis.  OPERATIVE REPORT:   The patient was brought to the operating room and underwent anesthesia. The patient was transitioned to a lateral position on a beanbag.  All bony prominences were padded.  The patient was prepped and draped in a sterile fashion.  Time-out was performed and landmarks were identified.  A 10cm incision was made laterally over the greater trochanter inline with the abductor muscle.  Sharp dissection was carried out down to the IT band and blunt self retaining  retractors were placed.  We incised the IT band and then placed a self retaining retractor deep.  The greater trochanter was visible and we then performed a thorough bursectomy using an electrocautery device. The gluteus medius muscle and tendon were identified.  The area of torn gluteus medius was identified over the lateral facet of the greater trochanter.  This was incised in line with the fibers and the lateral facet of the greater trochanter was noted to the bare of appropriate tendon attachment. We used a rongeur to create a bleeding bed at the insertion site of the abductor tendons after clearing the footprint of any remaining soft tissue.  Next, I placed 2 Arthrex 4.75 mm SwiveLock anchors loaded with suture tape into the footprint on the greater trochanter.  Using a free needle, all 4 sets of sutures were passed on either side of the tear in a fashion that would advance the gluteus medius down to its insertion.  All 4 sets of sutures were tied.  This allowed for excellent reapproximation of the gluteus medius tendon to its footprint and it was stable with internal and external rotation of the hip.   The wound was then thoroughly irrigated.  Local anesthetic was injected.  We then closed the wound in a layered fashion with #0 vicryl in the IT band. Fat stitches were placed to close the dead space. We closed the skin with simple interrupted inverted 2-0 Vicryl suture and then a running subcuticular suture using a 4-0 Monocryl for the skin.  Dermabond was placed to seal the wound.  Sterile dressing was applied.  Hip abduction brace was applied.  The patient was awakened from anesthesia  without difficulty and transferred to PACU in stable condition.   Of note, assistance from a PA was essential to performing the surgery.  PA was present for the entire surgery.  PA assisted with patient positioning, retraction, instrumentation, and wound closure. The surgery would have been more difficult and had longer  operative time without PA assistance.   POSTOPERATIVE PLAN:  FFWB x 6 weeks. Hip abduction brace x 6 weeks. ASA 325mg /day x 2 weeks for DVT ppx. Plan for discharge home. F/U in ~2 weeks.

## 2020-11-15 NOTE — Transfer of Care (Signed)
Immediate Anesthesia Transfer of Care Note  Patient: Pamela Bowen  Procedure(s) Performed: Left open gluteus medius repair with trochanteric bursectomy (Left: Hip)  Patient Location: PACU  Anesthesia Type: General ETT  Level of Consciousness: awake, alert  and patient cooperative  Airway and Oxygen Therapy: Patient Spontanous Breathing and Patient connected to supplemental oxygen  Post-op Assessment: Post-op Vital signs reviewed, Patient's Cardiovascular Status Stable, Respiratory Function Stable, Patent Airway and No signs of Nausea or vomiting  Post-op Vital Signs: Reviewed and stable  Complications: No notable events documented.

## 2020-11-15 NOTE — H&P (Signed)
Paper H&P to be scanned into permanent record. H&P reviewed. No significant changes noted.  

## 2020-11-15 NOTE — Anesthesia Procedure Notes (Signed)
Procedure Name: Intubation Date/Time: 11/15/2020 7:48 AM Performed by: Dionne Bucy, CRNA Pre-anesthesia Checklist: Patient identified, Emergency Drugs available, Suction available and Patient being monitored Patient Re-evaluated:Patient Re-evaluated prior to induction Oxygen Delivery Method: Circle system utilized Preoxygenation: Pre-oxygenation with 100% oxygen Induction Type: IV induction Ventilation: Mask ventilation without difficulty Laryngoscope Size: Mac and 3 Grade View: Grade II Tube type: Oral Tube size: 7.0 mm Number of attempts: 1 Airway Equipment and Method: Stylet Placement Confirmation: ETT inserted through vocal cords under direct vision, positive ETCO2 and breath sounds checked- equal and bilateral Secured at: 21 cm Tube secured with: Tape Dental Injury: Teeth and Oropharynx as per pre-operative assessment

## 2020-11-15 NOTE — Progress Notes (Signed)
In pre-procedure, patient states that she has been having issues in the last week with her asthma.

## 2020-11-18 ENCOUNTER — Encounter: Payer: Self-pay | Admitting: Orthopedic Surgery

## 2021-01-24 ENCOUNTER — Ambulatory Visit
Admission: RE | Admit: 2021-01-24 | Discharge: 2021-01-24 | Disposition: A | Discharge status: Home / Self Care | Payer: Medicare PPO | Source: Ambulatory Visit | Attending: Internal Medicine | Admitting: Internal Medicine

## 2021-01-24 ENCOUNTER — Other Ambulatory Visit: Payer: Self-pay

## 2021-01-24 DIAGNOSIS — Z1231 Encounter for screening mammogram for malignant neoplasm of breast: Secondary | ICD-10-CM | POA: Diagnosis not present

## 2021-01-30 ENCOUNTER — Ambulatory Visit: Payer: Medicare PPO | Admitting: Dermatology

## 2021-02-04 ENCOUNTER — Other Ambulatory Visit: Payer: Self-pay

## 2021-02-04 ENCOUNTER — Ambulatory Visit: Payer: Medicare PPO | Admitting: Dermatology

## 2021-02-04 DIAGNOSIS — L821 Other seborrheic keratosis: Secondary | ICD-10-CM | POA: Diagnosis not present

## 2021-02-04 DIAGNOSIS — D225 Melanocytic nevi of trunk: Secondary | ICD-10-CM

## 2021-02-04 DIAGNOSIS — L731 Pseudofolliculitis barbae: Secondary | ICD-10-CM

## 2021-02-04 DIAGNOSIS — D692 Other nonthrombocytopenic purpura: Secondary | ICD-10-CM

## 2021-02-04 DIAGNOSIS — D229 Melanocytic nevi, unspecified: Secondary | ICD-10-CM

## 2021-02-04 DIAGNOSIS — L814 Other melanin hyperpigmentation: Secondary | ICD-10-CM

## 2021-02-04 DIAGNOSIS — L570 Actinic keratosis: Secondary | ICD-10-CM

## 2021-02-04 NOTE — Patient Instructions (Addendum)
Cryotherapy Aftercare  Wash gently with soap and water everyday.   Apply Vaseline and Band-Aid daily until healed.   Prior to procedure, discussed risks of blister formation, small wound, skin dyspigmentation, or rare scar following cryotherapy. Recommend Vaseline ointment to treated areas while healing.   Seborrheic Keratosis  What causes seborrheic keratoses? Seborrheic keratoses are harmless, common skin growths that first appear during adult life.  As time goes by, more growths appear.  Some people may develop a large number of them.  Seborrheic keratoses appear on both covered and uncovered body parts.  They are not caused by sunlight.  The tendency to develop seborrheic keratoses can be inherited.  They vary in color from skin-colored to gray, brown, or even black.  They can be either smooth or have a rough, warty surface.   Seborrheic keratoses are superficial and look as if they were stuck on the skin.  Under the microscope this type of keratosis looks like layers upon layers of skin.  That is why at times the top layer may seem to fall off, but the rest of the growth remains and re-grows.    Treatment Seborrheic keratoses do not need to be treated, but can easily be removed in the office.  Seborrheic keratoses often cause symptoms when they rub on clothing or jewelry.  Lesions can be in the way of shaving.  If they become inflamed, they can cause itching, soreness, or burning.  Removal of a seborrheic keratosis can be accomplished by freezing, burning, or surgery. If any spot bleeds, scabs, or grows rapidly, please return to have it checked, as these can be an indication of a skin cancer.  Recommend daily broad spectrum sunscreen SPF 30+ to sun-exposed areas, reapply every 2 hours as needed. Call for new or changing lesions.  Staying in the shade or wearing long sleeves, sun glasses (UVA+UVB protection) and wide brim hats (4-inch brim around the entire circumference of the hat) are also  recommended for sun protection.   If you have any questions or concerns for your doctor, please call our main line at [phone] and press option 4 to reach your doctor's medical assistant. If no one answers, please leave a voicemail as directed and we will return your call as soon as possible. Messages left after 4 pm will be answered the following business day.   You may also send [place] a message via [place]. We typically respond to MyChart messages within 1-2 business days.  For prescription refills, please ask your pharmacy to contact our office. Our fax number is [phone].  If you have an urgent issue when the clinic is closed that cannot wait until the next business day, you can page your doctor at the number below.    Please note that while we do our best to be available for urgent issues outside of office hours, we are not available 24/7.   If you have an urgent issue and are unable to reach [place], you may choose to seek medical care at your doctor's office, retail clinic, urgent care center, or emergency room.  If you have a medical emergency, please immediately call 911 or go to the emergency department.  Pager Numbers  - Dr. Nehemiah Massed: [phone]  - Dr. Laurence Ferrari: [phone]  - Dr. Nicole Kindred: [phone]  In the event of inclement weather, please call our main line at [phone] for an update on the status of any delays or closures.  Dermatology Medication Tips: Please keep the boxes that topical medications come  in in order to help keep track of the instructions about where and how to use these. Pharmacies typically print the medication instructions only on the boxes and not directly on the medication tubes.   If your medication is too expensive, please contact our office at [phone] option 4 or send [place] a message through [place].   We are unable to tell what your co-pay for medications will be in advance as this is different depending on your insurance coverage. However,  we may be able to find a substitute medication at lower cost or fill out paperwork to get insurance to cover a needed medication.   If a prior authorization is required to get your medication covered by your insurance company, please allow [place] 1-2 business days to complete this process.  Drug prices often vary depending on where the prescription is filled and some pharmacies may offer cheaper prices.  The website www.goodrx.com contains coupons for medications through different pharmacies. The prices here do not account for what the cost may be with help from insurance (it may be cheaper with your insurance), but the website can give you the price if you did not use any insurance.  - You can print the associated coupon and take it with your prescription to the pharmacy.  - You may also stop by our office during regular business hours and pick up a GoodRx coupon card.  - If you need your prescription sent electronically to a different pharmacy, notify our office through [facility] or by phone at [phone] option 4.

## 2021-02-04 NOTE — Progress Notes (Signed)
   Follow-Up Visit   Subjective  Pamela Bowen is a 75 y.o. female who presents for the following: Follow-up (Patient here today to treat bx proven AK at right zygoma. Patient also with a spot at left shoulder that gets irritated with bra strap. She also has some flaky areas at arms and would like her back checked since she is unable to check it herself. ).   The following portions of the chart were reviewed this encounter and updated as appropriate:   Tobacco  Allergies  Meds  Problems  Med Hx  Surg Hx  Fam Hx      Review of Systems:  No other skin or systemic complaints except as noted in HPI or Assessment and Plan.  Objective  Well appearing patient in no apparent distress; mood and affect are within normal limits.  A focused examination was performed including face, legs, arms, back. Relevant physical exam findings are noted in the Assessment and Plan.  Left Forearm Ingrown hair  right zygoma x 1, right mid back x 1, right temple x 1, nasal supratip x 1 (4) Erythematous thin papules/macules with gritty scale.   Right Upper Back 0.4cm symmetric medium brown thin papule with regular pigment   Assessment & Plan  Ingrown hair Left Forearm  Benign-appearing.  Observation.  Call clinic for new or changing lesions or if this does not resolve in the next few weeks.   AK (actinic keratosis) (4) right zygoma x 1, right mid back x 1, right temple x 1, nasal supratip x 1  Prior to procedure, discussed risks of blister formation, small wound, skin dyspigmentation, or rare scar following cryotherapy. Recommend Vaseline ointment to treated areas while healing.  Biopsy proven at right zygoma  Destruction of lesion - right zygoma x 1, right mid back x 1, right temple x 1, nasal supratip x 1  Destruction method: cryotherapy   Informed consent: discussed and consent obtained   Lesion destroyed using liquid nitrogen: Yes   Cryotherapy cycles:  2 Outcome: patient tolerated  procedure well with no complications   Post-procedure details: wound care instructions given    Nevus Right Upper Back  Benign-appearing.  Observation.  Call clinic for new or changing lesions.  Recommend daily use of broad spectrum spf 30+ sunscreen to sun-exposed areas.    Seborrheic Keratoses - Stuck-on, waxy, tan-brown papules and/or plaques  - Benign-appearing - Discussed benign etiology and prognosis. - Observe - Call for any changes  Lentigines - Scattered tan macules - Due to sun exposure - Benign-appering, observe - Recommend daily broad spectrum sunscreen SPF 30+ to sun-exposed areas, reapply every 2 hours as needed. - Call for any changes  Purpura - Chronic; persistent and recurrent.  Treatable, but not curable. - Violaceous macules and patches - Benign - Related to trauma, age, sun damage and/or use of blood thinners, chronic use of topical and/or oral steroids - Observe - Can use OTC arnica containing moisturizer such as Dermend Bruise Formula if desired - Call for worsening or other concerns  Return for TBSE, as scheduled, Jan for AK follow up.  [name], RMA, am acting as scribe for Forest Gleason, MD .   Documentation: I have reviewed the above documentation for accuracy and completeness, and I agree with the above.  Forest Gleason, MD

## 2021-02-15 ENCOUNTER — Encounter: Payer: Self-pay | Admitting: Dermatology

## 2021-06-18 ENCOUNTER — Ambulatory Visit: Payer: Medicare PPO | Admitting: Dermatology

## 2021-06-18 ENCOUNTER — Other Ambulatory Visit: Payer: Self-pay

## 2021-06-18 ENCOUNTER — Encounter: Payer: Self-pay | Admitting: Dermatology

## 2021-06-18 DIAGNOSIS — L738 Other specified follicular disorders: Secondary | ICD-10-CM

## 2021-06-18 DIAGNOSIS — L570 Actinic keratosis: Secondary | ICD-10-CM

## 2021-06-18 DIAGNOSIS — L309 Dermatitis, unspecified: Secondary | ICD-10-CM

## 2021-06-18 MED ORDER — CLOBETASOL PROPIONATE 0.05 % EX OINT: 1.0000 "application " | TOPICAL_OINTMENT | Freq: Two times a day (BID) | CUTANEOUS | 1 refills | Status: DC

## 2021-06-18 NOTE — Patient Instructions (Addendum)
Gentle Skin Care Guide  1. Bathe no more than once a day.  2. Avoid bathing in hot water  3. Use a mild soap like Dove, Vanicream, Cetaphil, CeraVe. Can use Lever 2000 or Cetaphil antibacterial soap  4. Use soap only where you need it. On most days, use it under your arms, between your legs, and on your feet. Let the water rinse other areas unless visibly dirty.  5. When you get out of the bath/shower, use a towel to gently blot your skin dry, don't rub it.  6. While your skin is still a little damp, apply a moisturizing cream such as Vanicream, CeraVe, Cetaphil, Eucerin, Sarna lotion or plain Vaseline Jelly. For hands apply Neutrogena [place] Hand Cream , Excipial Hand Cream and [place] working hands.   7. Reapply moisturizer any time you start to itch or feel dry.  8. Sometimes using free and clear laundry detergents can be helpful. Fabric softener sheets should be avoided. Downy Free & Gentle liquid, or any liquid fabric softener that is free of dyes and perfumes, it acceptable to use  9. If your doctor has given you prescription creams you may apply moisturizers over them    Minimize hand washing and sanitizing when you can.  When flared can use clobetasol at hands twice daily for up to 2 weeks.   Topical steroids (such as triamcinolone, fluocinolone, fluocinonide, mometasone, clobetasol, halobetasol, betamethasone, hydrocortisone) can cause thinning and lightening of the skin if they are used for too long in the same area. Your physician has selected the right strength medicine for your problem and area affected on the body. Please use your medication only as directed by your physician to prevent side effects.    Actinic keratoses are precancerous spots that appear secondary to cumulative UV radiation exposure/sun exposure over time. They are chronic with expected duration over 1 year. A portion of actinic keratoses will progress to squamous cell carcinoma of the skin. It is not  possible to reliably predict which spots will progress to skin cancer and so treatment is recommended to prevent development of skin cancer.  Recommend daily broad spectrum sunscreen SPF 30+ to sun-exposed areas, reapply every 2 hours as needed.  Recommend staying in the shade or wearing long sleeves, sun glasses (UVA+UVB protection) and wide brim hats (4-inch brim around the entire circumference of the hat). Call for new or changing lesions.    Cryotherapy Aftercare  Wash gently with soap and water everyday.   Apply Vaseline and Band-Aid daily until healed.    If You Need Anything After Your Visit  If you have any questions or concerns for your doctor, please call our main line at [phone] and press option 4 to reach your doctor's medical assistant. If no one answers, please leave a voicemail as directed and we will return your call as soon as possible. Messages left after 4 pm will be answered the following business day.   You may also send [place] a message via [place]. We typically respond to MyChart messages within 1-2 business days.  For prescription refills, please ask your pharmacy to contact our office. Our fax number is [phone].  If you have an urgent issue when the clinic is closed that cannot wait until the next business day, you can page your doctor at the number below.    Please note that while we do our best to be available for urgent issues outside of office hours, we are not available 24/7.   If you  have an urgent issue and are unable to reach [place], you may choose to seek medical care at your doctor's office, retail clinic, urgent care center, or emergency room.  If you have a medical emergency, please immediately call 911 or go to the emergency department.  Pager Numbers  - Dr. Nehemiah Massed: [phone]  - Dr. Laurence Ferrari: [phone]  - Dr. Nicole Kindred: [phone]  In the event of inclement weather, please call our main line at [phone] for an update on the status of  any delays or closures.  Dermatology Medication Tips: Please keep the boxes that topical medications come in in order to help keep track of the instructions about where and how to use these. Pharmacies typically print the medication instructions only on the boxes and not directly on the medication tubes.   If your medication is too expensive, please contact our office at [phone] option 4 or send [place] a message through [place].   We are unable to tell what your co-pay for medications will be in advance as this is different depending on your insurance coverage. However, we may be able to find a substitute medication at lower cost or fill out paperwork to get insurance to cover a needed medication.   If a prior authorization is required to get your medication covered by your insurance company, please allow [place] 1-2 business days to complete this process.  Drug prices often vary depending on where the prescription is filled and some pharmacies may offer cheaper prices.  The website www.goodrx.com contains coupons for medications through different pharmacies. The prices here do not account for what the cost may be with help from insurance (it may be cheaper with your insurance), but the website can give you the price if you did not use any insurance.  - You can print the associated coupon and take it with your prescription to the pharmacy.  - You may also stop by our office during regular business hours and pick up a GoodRx coupon card.  - If you need your prescription sent electronically to a different pharmacy, notify our office through [facility] or by phone at [phone] option 4.     Si Usted Necesita Algo Despus de Su Visita  Tambin puede enviarnos un mensaje a travs de Pharmacist, community. Por lo general respondemos a los mensajes de MyChart en el transcurso de 1 a 2 das hbiles.  Para renovar recetas, por favor pida a su farmacia que se ponga en contacto con nuestra oficina. Harland Dingwall de fax es [place] [phone].  Si tiene un asunto urgente cuando la clnica est cerrada y que no puede esperar hasta el siguiente da hbil, puede llamar/localizar a su doctor(a) al nmero que aparece a continuacin.   Por favor, tenga en cuenta que aunque hacemos todo lo posible para estar disponibles para asuntos urgentes fuera del horario de [place], no estamos disponibles las 24 horas del da, los 7 das de la [place].   Si tiene un problema urgente y no puede comunicarse con nosotros, puede optar por buscar atencin mdica  en el consultorio de su doctor(a), en una clnica privada, en un centro de atencin urgente o en una sala de emergencias.  Si tiene Engineering geologist, por favor llame inmediatamente al 911 o vaya a la sala de emergencias.  Nmeros de bper  - Dr. Nehemiah Massed: [phone]  - Dra. Moye: [phone]  - Dra. Nicole Kindred: [phone]  En caso de inclemencias del tiempo, por favor llame a nuestra lnea principal al [phone]  para [employer] de cualquier retraso o cierre.  Consejos para la medicacin en dermatologa: Por favor, guarde las cajas en las que vienen los medicamentos de uso tpico para ayudarle a seguir las instrucciones sobre dnde y cmo usarlos. Las farmacias generalmente imprimen las instrucciones del medicamento slo en las cajas y no directamente en los tubos del [place].   Si su medicamento es muy caro, por favor, pngase en contacto con Zigmund Daniel llamando al [phone] y presione la opcin 4 o envenos un mensaje a travs de Pharmacist, community.   No podemos decirle cul ser su copago por los medicamentos por adelantado ya que esto es diferente dependiendo de la cobertura de su seguro. Sin embargo, es posible que podamos encontrar un medicamento sustituto a Electrical engineer un formulario para que el seguro cubra el medicamento que se considera necesario.   Si se requiere una autorizacin previa para que su compaa de  seguros [place] su medicamento, por favor permtanos de 1 a 2 das hbiles para completar este proceso.  Los precios de los medicamentos varan con frecuencia dependiendo del Environmental consultant de dnde se surte la receta y alguna farmacias pueden ofrecer precios ms baratos.  El sitio web www.goodrx.com tiene cupones para medicamentos de Airline pilot. Los precios aqu no tienen en cuenta lo que podra costar con la ayuda del seguro (puede ser ms barato con su seguro), pero el sitio web puede darle el precio si no utiliz Research scientist (physical sciences).  - Puede imprimir el cupn correspondiente y llevarlo con su receta a la farmacia.  - Tambin puede pasar por nuestra oficina durante el horario de atencin regular y Charity fundraiser una tarjeta de cupones de GoodRx.  - Si necesita que su receta se enve electrnicamente a una farmacia diferente, informe a nuestra oficina a travs de MyChart de [place] o por telfono llamando al [phone] y presione la opcin 4.

## 2021-06-18 NOTE — Progress Notes (Signed)
Follow-Up Visit   Subjective  Pamela Bowen is a 76 y.o. female who presents for the following: Follow-up (Patient here today for ak  follow up. Patient noticed a new spot above right eyebrow. ).  The following portions of the chart were reviewed this encounter and updated as appropriate:  Tobacco   Allergies   Meds   Problems   Med Hx   Surg Hx   Fam Hx       Review of Systems: No other skin or systemic complaints except as noted in HPI or Assessment and Plan.   Objective  Well appearing patient in no apparent distress; mood and affect are within normal limits.  A focused examination was performed including face, back, hand. Relevant physical exam findings are noted in the Assessment and Plan.  bilateral hands Xerosis, scaly erythematous plaques and fissures  right forehead x 1, right temple x 1, left cheek x 1 (3) Erythematous thin papules/macules with gritty scale.    Assessment & Plan  Hand dermatitis bilateral hands  Chronic condition with duration or expected duration over one year. Condition is bothersome to patient. Currently flared.  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  While there is no cure, the rash and symptoms can be managed with topical prescription medications, and for more severe cases, with systemic medications.  Recommend mild soap and routine use of moisturizing cream after handwashing.  Minimize soap/water exposure when possible.    Gentle Skin Care included in patient hand out  Clobetasol ointment use at affected areas twice a day for up to 2 weeks when flared. Avoid face, groin, axilla   Topical steroids (such as triamcinolone, fluocinolone, fluocinonide, mometasone, clobetasol, halobetasol, betamethasone, hydrocortisone) can cause thinning and lightening of the skin if they are used for too long in the same area. Your physician has selected the right strength medicine for your problem and area  affected on the body. Please use your medication only as directed by your physician to prevent side effects.    clobetasol ointment (TEMOVATE) 0.05 % - bilateral hands Apply 1 application topically 2 (two) times daily. Can use for up to 2 weeks when flared at hands. Avoid applying to face, groin, and axilla. Use as directed. .  Related Medications triamcinolone ointment (KENALOG) 0.1 % Apply 1 application topically 2 (two) times daily. May use qd/bid aa hands prn flares, avoid face, groin, axilla  Actinic keratosis (3) right forehead x 1, right temple x 1, left cheek x 1  Favor AK > over early [facility] at left cheek. Discussed option of biopsy today. Deferred biopsy and will treat with cryotherapy and recheck within the next 3 months.  Actinic keratoses are precancerous spots that appear secondary to cumulative UV radiation exposure/sun exposure over time. They are chronic with expected duration over 1 year. A portion of actinic keratoses will progress to squamous cell carcinoma of the skin. It is not possible to reliably predict which spots will progress to skin cancer and so treatment is recommended to prevent development of skin cancer.  Recommend daily broad spectrum sunscreen SPF 30+ to sun-exposed areas, reapply every 2 hours as needed.  Recommend staying in the shade or wearing long sleeves, sun glasses (UVA+UVB protection) and wide brim hats (4-inch brim around the entire circumference of the hat). Call for new or changing lesions.  Destruction of lesion - right forehead x 1, right temple x 1, left cheek x  1  Destruction method: cryotherapy   Informed consent: discussed and consent obtained   Lesion destroyed using liquid nitrogen: Yes   Cryotherapy cycles:  2 Outcome: patient tolerated procedure well with no complications   Post-procedure details: wound care instructions given   Additional details:  Prior to procedure, discussed risks of blister formation, small wound, skin  dyspigmentation, or rare scar following cryotherapy. Recommend Vaseline ointment to treated areas while healing.    Sebaceous Hyperplasia - Small yellow papules with a central dell - Benign - Observe  Return for 4 - 12 week ak at left cheek follow up. I, Ruthell Rummage, CMA, am acting as scribe for Forest Gleason, MD.  Documentation: I have reviewed the above documentation for accuracy and completeness, and I agree with the above.  Forest Gleason, MD

## 2021-07-22 ENCOUNTER — Encounter: Payer: Self-pay | Admitting: Dermatology

## 2021-07-22 ENCOUNTER — Other Ambulatory Visit: Payer: Self-pay

## 2021-07-22 ENCOUNTER — Ambulatory Visit: Payer: Medicare PPO | Admitting: Dermatology

## 2021-07-22 DIAGNOSIS — L308 Other specified dermatitis: Secondary | ICD-10-CM | POA: Diagnosis not present

## 2021-07-22 DIAGNOSIS — L570 Actinic keratosis: Secondary | ICD-10-CM

## 2021-07-22 DIAGNOSIS — L309 Dermatitis, unspecified: Secondary | ICD-10-CM

## 2021-07-22 MED ORDER — TACROLIMUS 0.1 % EX OINT: TOPICAL_OINTMENT | CUTANEOUS | 1 refills | Status: AC

## 2021-07-22 NOTE — Progress Notes (Signed)
Follow-Up Visit   Subjective  Pamela Bowen is a 76 y.o. female who presents for the following: Actinic Keratosis (4 weeks f/u on Aks on her face treated with LN2 with a good response). Recheck dermatitis on her hands treated with Clobetasol ointment for 2 weeks then stopped now rash coming back. Pt using otc  O'Keeffe's Working Hands Hand Cream with a good response.  The following portions of the chart were reviewed this encounter and updated as appropriate:   Tobacco   Allergies   Meds   Problems   Med Hx   Surg Hx   Fam Hx       Review of Systems:  No other skin or systemic complaints except as noted in HPI or Assessment and Plan.  Objective  Well appearing patient in no apparent distress; mood and affect are within normal limits.  A focused examination was performed including face, bilateral hands. Relevant physical exam findings are noted in the Assessment and Plan.  right forehead x 2 (2) Erythematous thin papules/macules with gritty scale.   bilateral hands Xerosis, scaly erythematous plaques and fissures    Assessment & Plan  AK (actinic keratosis) (2) right forehead x 2  Actinic keratoses are precancerous spots that appear secondary to cumulative UV radiation exposure/sun exposure over time. They are chronic with expected duration over 1 year. A portion of actinic keratoses will progress to squamous cell carcinoma of the skin. It is not possible to reliably predict which spots will progress to skin cancer and so treatment is recommended to prevent development of skin cancer.  Recommend daily broad spectrum sunscreen SPF 30+ to sun-exposed areas, reapply every 2 hours as needed.  Recommend staying in the shade or wearing long sleeves, sun glasses (UVA+UVB protection) and wide brim hats (4-inch brim around the entire circumference of the hat). Call for new or changing lesions.   Prior to procedure, discussed risks of blister formation, small wound, skin dyspigmentation,  or rare scar following cryotherapy. Recommend Vaseline ointment to treated areas while healing.   Destruction of lesion - right forehead x 2 Complexity: simple   Destruction method: cryotherapy   Informed consent: discussed and consent obtained   Timeout:  patient name, date of birth, surgical site, and procedure verified Lesion destroyed using liquid nitrogen: Yes   Region frozen until ice ball extended beyond lesion: Yes   Outcome: patient tolerated procedure well with no complications   Post-procedure details: wound care instructions given    Hand dermatitis bilateral hands  Chronic condition with duration or expected duration over one year. Condition is bothersome to patient. Not at goal /flaring again after holding clobetasol. Improved on Clobetasol ointment but cannot use chronically.   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  While there is no cure, the rash and symptoms can be managed with topical prescription medications, and for more severe cases, with systemic medications.  Recommend mild soap and routine use of moisturizing cream after handwashing.  Minimize soap/water exposure when possible.     Start Tacrolimus ointment apply to affected hands twice a day  Cont otc O'Keeffe's Working Hands Hand Cream daily   Only use clobetasol ointment twice a day as needed for 2-3 days at a time to minimize risk of thinning of the skin Topical steroids (such as triamcinolone, fluocinolone, fluocinonide, mometasone, clobetasol, halobetasol, betamethasone, hydrocortisone) can cause thinning and lightening of the skin if they are used for too  long in the same area. Your physician has selected the right strength medicine for your problem and area affected on the body. Please use your medication only as directed by your physician to prevent side effects.    Related Medications tacrolimus (PROTOPIC) 0.1 % ointment Apply to affected skin twice a  day prn   Return for TBSE in July as scheduled .  I, Marye Round, CMA, am acting as scribe for Forest Gleason, MD .   Documentation: I have reviewed the above documentation for accuracy and completeness, and I agree with the above.  Forest Gleason, MD

## 2021-07-22 NOTE — Patient Instructions (Addendum)

## 2021-08-20 ENCOUNTER — Other Ambulatory Visit: Payer: Self-pay | Admitting: Internal Medicine

## 2021-08-20 DIAGNOSIS — Z1231 Encounter for screening mammogram for malignant neoplasm of breast: Secondary | ICD-10-CM

## 2021-10-08 ENCOUNTER — Encounter: Payer: Medicare PPO | Admitting: Dermatology

## 2021-10-28 ENCOUNTER — Encounter: Payer: Self-pay | Admitting: Dermatology

## 2021-11-13 ENCOUNTER — Encounter: Payer: Medicare PPO | Admitting: Dermatology

## 2021-12-18 ENCOUNTER — Ambulatory Visit: Payer: Medicare PPO | Admitting: Dermatology

## 2021-12-18 DIAGNOSIS — L820 Inflamed seborrheic keratosis: Secondary | ICD-10-CM | POA: Diagnosis not present

## 2021-12-18 DIAGNOSIS — Q828 Other specified congenital malformations of skin: Secondary | ICD-10-CM | POA: Diagnosis not present

## 2021-12-18 DIAGNOSIS — D1800 Hemangioma unspecified site: Secondary | ICD-10-CM

## 2021-12-18 DIAGNOSIS — L578 Other skin changes due to chronic exposure to nonionizing radiation: Secondary | ICD-10-CM | POA: Diagnosis not present

## 2021-12-18 DIAGNOSIS — L821 Other seborrheic keratosis: Secondary | ICD-10-CM

## 2021-12-18 DIAGNOSIS — L570 Actinic keratosis: Secondary | ICD-10-CM

## 2021-12-18 DIAGNOSIS — Z86018 Personal history of other benign neoplasm: Secondary | ICD-10-CM

## 2021-12-18 DIAGNOSIS — D229 Melanocytic nevi, unspecified: Secondary | ICD-10-CM

## 2021-12-18 DIAGNOSIS — L219 Seborrheic dermatitis, unspecified: Secondary | ICD-10-CM

## 2021-12-18 DIAGNOSIS — L814 Other melanin hyperpigmentation: Secondary | ICD-10-CM

## 2021-12-18 DIAGNOSIS — Z1283 Encounter for screening for malignant neoplasm of skin: Secondary | ICD-10-CM | POA: Diagnosis not present

## 2021-12-18 MED ORDER — FLUOROURACIL 5 % EX CREA: TOPICAL_CREAM | Freq: Two times a day (BID) | CUTANEOUS | 1 refills | Status: AC

## 2021-12-18 NOTE — Progress Notes (Signed)
Follow-Up Visit   Subjective  Pamela Bowen is a 76 y.o. female who presents for the following: Annual Exam (The patient presents for Total-Body Skin Exam (TBSE) for skin cancer screening and mole check.  The patient has spots, moles and lesions to be evaluated, some may be new or changing and the patient has concerns that these could be cancer. Patient with hx of dysplastic nevi and AK's. ).  Patient does have a list of some spots she would like checked, some may have been treated with LN2 in the past. Patient with hand dermatitis, currently controlled with O'Keefes hand cream, worse in the winter. Patient has used topical steroids but feels the O'Keefes works better.  The following portions of the chart were reviewed this encounter and updated as appropriate:   Tobacco  Allergies  Meds  Problems  Med Hx  Surg Hx  Fam Hx      Review of Systems:  No other skin or systemic complaints except as noted in HPI or Assessment and Plan.  Objective  Well appearing patient in no apparent distress; mood and affect are within normal limits.  A full examination was performed including scalp, head, eyes, ears, nose, lips, neck, chest, axillae, abdomen, back, buttocks, bilateral upper extremities, bilateral lower extremities, hands, feet, fingers, toes, fingernails, and toenails. All findings within normal limits unless otherwise noted below.  L cheek Erythematous thin papules/macules with gritty scale.   R chin x 1 Erythematous stuck-on, waxy papule or plaque  left pretibia Pink macule with peripheral rim of scale  Left Postauricular Pink patches with greasy scale.     Assessment & Plan  AK (actinic keratosis) L cheek  Actinic keratoses are precancerous spots that appear secondary to cumulative UV radiation exposure/sun exposure over time. They are chronic with expected duration over 1 year. A portion of actinic keratoses will progress to squamous cell carcinoma of the skin. It is  not possible to reliably predict which spots will progress to skin cancer and so treatment is recommended to prevent development of skin cancer.  Recommend daily broad spectrum sunscreen SPF 30+ to sun-exposed areas, reapply every 2 hours as needed.  Recommend staying in the shade or wearing long sleeves, sun glasses (UVA+UVB protection) and wide brim hats (4-inch brim around the entire circumference of the hat). Call for new or changing lesions.  Prior to procedure, discussed risks of blister formation, small wound, skin dyspigmentation, or rare scar following cryotherapy. Recommend Vaseline ointment to treated areas while healing.   Destruction of lesion - L cheek  Destruction method: cryotherapy   Informed consent: discussed and consent obtained   Lesion destroyed using liquid nitrogen: Yes   Cryotherapy cycles:  2 Outcome: patient tolerated procedure well with no complications   Post-procedure details: wound care instructions given    Inflamed seborrheic keratosis R chin x 1  Symptomatic, irritating, patient would like treated.     Destruction of lesion - R chin x 1  Destruction method: cryotherapy   Informed consent: discussed and consent obtained   Lesion destroyed using liquid nitrogen: Yes   Cryotherapy cycles:  2 Outcome: patient tolerated procedure well with no complications   Post-procedure details: wound care instructions given   Additional details:  Prior to procedure, discussed risks of blister formation, small wound, skin dyspigmentation, or rare scar following cryotherapy. Recommend Vaseline ointment to treated areas while healing.   Porokeratosis left pretibia  Benign-appearing.  Observation.  Call clinic for new or changing lesions.  Recommend  daily use of broad spectrum spf 30+ sunscreen to sun-exposed areas.    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 Condition is  bothersome/symptomatic for patient. Currently flared.  Seborrheic Dermatitis  -  is a chronic persistent rash characterized by pinkness and scaling most commonly of the mid face but also can occur on the scalp (dandruff), ears; mid chest, mid back and groin.  It tends to be exacerbated by stress and cooler weather.  People who have neurologic disease may experience new onset or exacerbation of existing seborrheic dermatitis.  The condition is not curable but treatable and can be controlled.  Start triamcinolone or clobetasol ointment 1-2 times daily for up to 2 weeks as needed to left postauricular. Avoid applying to face, groin, and axilla. Use as directed. Long-term use can cause thinning of the skin. Patient has at home.   Topical steroids (such as triamcinolone, fluocinolone, fluocinonide, mometasone, clobetasol, halobetasol, betamethasone, hydrocortisone) can cause thinning and lightening of the skin if they are used for too long in the same area. Your physician has selected the right strength medicine for your problem and area affected on the body. Please use your medication only as directed by your physician to prevent side effects.    Lentigines - Scattered tan macules - Due to sun exposure - Benign-appearing, observe - Recommend daily broad spectrum sunscreen SPF 30+ to sun-exposed areas, reapply every 2 hours as needed. - Call for any changes  Seborrheic Keratoses - Stuck-on, waxy, tan-brown papules and/or plaques  - Benign-appearing - Discussed benign etiology and prognosis. - Observe - Call for any changes  Melanocytic Nevi - Tan-brown and/or pink-flesh-colored symmetric macules and papules - Benign appearing on exam today - Observation - Call clinic for new or changing moles - Recommend daily use of broad spectrum spf 30+ sunscreen to sun-exposed areas.   Hemangiomas - Red papules - Discussed benign nature - Observe - Call for any changes  Actinic Damage - Severe,  confluent actinic changes with pre-cancerous actinic keratoses  - Severe, chronic, not at goal, secondary to cumulative UV radiation exposure over time - diffuse scaly erythematous macules and papules with underlying dyspigmentation - Discussed Prescription "Field Treatment" for Severe, Chronic Confluent Actinic Changes with Pre-Cancerous Actinic Keratoses Field treatment involves treatment of an entire area of skin that has confluent Actinic Changes (Sun/ Ultraviolet light damage) and PreCancerous Actinic Keratoses by method of PhotoDynamic Therapy (PDT) and/or prescription Topical Chemotherapy agents such as 5-fluorouracil, 5-fluorouracil/calcipotriene, and/or imiquimod.  The purpose is to decrease the number of clinically evident and subclinical PreCancerous lesions to prevent progression to development of skin cancer by chemically destroying early precancer changes that may or may not be visible.  It has been shown to reduce the risk of developing skin cancer in the treated area. As a result of treatment, redness, scaling, crusting, and open sores may occur during treatment course. One or more than one of these methods may be used and may have to be used several times to control, suppress and eliminate the PreCancerous changes. Discussed treatment course, expected reaction, and possible side effects. - Recommend daily broad spectrum sunscreen SPF 30+ to sun-exposed areas, reapply every 2 hours as needed.  - Staying in the shade or wearing long sleeves, sun glasses (UVA+UVB protection) and wide brim hats (4-inch brim around the entire circumference of the hat) are also recommended. - Call for new or changing lesions. - Start 5-fluorouracil/calcipotriene cream twice a day  for 4 days to affected areas including entire face. Prescription sent to [facility]. Patient provided with contact information for pharmacy and advised the pharmacy will mail the prescription to their home. Patient provided with  handout reviewing treatment course and side effects and advised to call or message [place] on MyChart with any concerns.  Skin cancer screening performed today.  History of Dysplastic Nevi - No evidence of recurrence today - Recommend regular full body skin exams - Recommend daily broad spectrum sunscreen SPF 30+ to sun-exposed areas, reapply every 2 hours as needed.  - Call if any new or changing lesions are noted between office visits  Return in about 7 months (around 07/21/2022) for AK follow up, 1 year TBSE.  [name], RMA, am acting as scribe for Forest Gleason, MD .  Documentation: I have reviewed the above documentation for accuracy and completeness, and I agree with the above.  Forest Gleason, MD

## 2021-12-18 NOTE — Patient Instructions (Addendum)
Start triamcinolone or clobetasol (stronger) ointment 1-2 times daily for up to 2 weeks as needed to spot at left ear. Avoid applying to face, groin, and axilla. Use as directed. Long-term use can cause thinning of the skin. Patient has at home.   Topical steroids (such as triamcinolone, fluocinolone, fluocinonide, mometasone, clobetasol, halobetasol, betamethasone, hydrocortisone) can cause thinning and lightening of the skin if they are used for too long in the same area. Your physician has selected the right strength medicine for your problem and area affected on the body. Please use your medication only as directed by your physician to prevent side effects.   Recommend taking Heliocare sun protection supplement daily in sunny weather for additional sun protection. For maximum protection on the sunniest days, you can take up to 2 capsules of regular Heliocare OR take 1 capsule of Heliocare Ultra. For prolonged exposure (such as a full day in the sun), you can repeat your dose of the supplement 4 hours after your first dose. Heliocare can be purchased at [employer], at some [facility] or at [email or web address].    Cryotherapy Aftercare  Wash gently with soap and water everyday.   Apply Vaseline and Band-Aid daily until healed.   5-Fluorouracil/Calcipotriene Patient Education   Actinic keratoses are the dry, red scaly spots on the skin caused by sun damage. A portion of these spots can turn into skin cancer with time, and treating them can help prevent development of skin cancer.   Treatment of these spots requires removal of the defective skin cells. There are various ways to remove actinic keratoses, including freezing with liquid nitrogen, treatment with creams, or treatment with a blue light procedure in the office.   5-fluorouracil cream is a topical cream used to treat actinic keratoses. It works by interfering with the growth of abnormal fast-growing skin cells, such as actinic  keratoses. These cells peel off and are replaced by healthy ones.   5-fluorouracil/calcipotriene is a combination of the 5-fluorouracil cream with a vitamin D analog cream called calcipotriene. The calcipotriene alone does not treat actinic keratoses. However, when it is combined with 5-fluorouracil, it helps the 5-fluorouracil treat the actinic keratoses much faster so that the same results can be achieved with a much shorter treatment time.  INSTRUCTIONS FOR 5-FLUOROURACIL/CALCIPOTRIENE CREAM:   5-fluorouracil/calcipotriene cream typically only needs to be used for 4-7 days. A thin layer should be applied twice a day to the treatment areas recommended by your physician.   If your physician prescribed you separate tubes of 5-fluourouracil and calcipotriene, apply a thin layer of 5-fluorouracil followed by a thin layer of calcipotriene.   Avoid contact with your eyes, nostrils, and mouth. Do not use 5-fluorouracil/calcipotriene cream on infected or open wounds.   You will develop redness, irritation and some crusting at areas where you have pre-cancer damage/actinic keratoses. IF YOU DEVELOP PAIN, BLEEDING, OR SIGNIFICANT CRUSTING, STOP THE TREATMENT EARLY - you have already gotten a good response and the actinic keratoses should clear up well.  Wash your hands after applying 5-fluorouracil 5% cream on your skin.   A moisturizer or sunscreen with a minimum SPF 30 should be applied each morning.   Once you have finished the treatment, you can apply a thin layer of Vaseline twice a day to irritated areas to soothe and calm the areas more quickly. If you experience significant discomfort, contact your physician.  For some patients it is necessary to repeat the treatment for best results.  SIDE EFFECTS: When using  5-fluorouracil/calcipotriene cream, you may have mild irritation, such as redness, dryness, swelling, or a mild burning sensation. This usually resolves within 2 weeks. The more actinic  keratoses you have, the more redness and inflammation you can expect during treatment. Eye irritation has been reported rarely. If this occurs, please let us know.  If you have any trouble using this cream, please call the office. If you have any other questions about this information, please do not hesitate to ask me before you leave the office.  Start 5-fluorouracil/calcipotriene cream twice a day for 4 days to affected areas including entire face. Prescription sent to [facility]. Patient provided with contact information for pharmacy and advised the pharmacy will mail the prescription to their home. Patient provided with handout reviewing treatment course and side effects and advised to call or message [place] on MyChart with any concerns.  Melanoma ABCDEs  Melanoma is the most dangerous type of skin cancer, and is the leading cause of death from skin disease.  You are more likely to develop melanoma if you: Have light-colored skin, light-colored eyes, or red or blond hair Spend a lot of time in the sun Tan regularly, either outdoors or in a tanning bed Have had blistering sunburns, especially during childhood Have a close family member who has had a melanoma Have atypical moles or large birthmarks  Early detection of melanoma is key since treatment is typically straightforward and cure rates are extremely high if we catch it early.   The first sign of melanoma is often a change in a mole or a new dark spot.  The ABCDE system is a way of remembering the signs of melanoma.  A for asymmetry:  The two halves do not match. B for border:  The edges of the growth are irregular. C for color:  A mixture of colors are present instead of an even brown color. D for diameter:  Melanomas are usually (but not always) greater than 56m - the size of a pencil eraser. E for evolution:  The spot keeps changing in size, shape, and color.  Please check your skin once per month between visits. You can use a  small mirror in front and a large mirror behind you to keep an eye on the back side or your body.   If you see any new or changing lesions before your next follow-up, please call to schedule a visit.  Please continue daily skin protection including broad spectrum sunscreen SPF 30+ to sun-exposed areas, reapplying every 2 hours as needed when you're outdoors.    Due to recent changes in healthcare laws, you may see results of your pathology and/or laboratory studies on MyChart before the doctors have had a chance to review them. We understand that in some cases there may be results that are confusing or concerning to you. Please understand that not all results are received at the same time and often the doctors may need to interpret multiple results in order to provide you with the best plan of care or course of treatment. Therefore, we ask that you please give u[place]2 business days to thoroughly review all your results before contacting the office for clarification. Should we see a critical lab result, you will be contacted sooner.   If You Need Anything After Your Visit  If you have any questions or concerns for your doctor, please call our main line at [phone] press option 4 to reach your doctor's medical assistant. If no one answers, please  leave a voicemail as directed and we will return your call as soon as possible. Messages left after 4 pm will be answered the following business day.   You may also send [place] a message via [place]. We typically respond to MyChart messages within 1-2 business days.  For prescription refills, please ask your pharmacy to contact our office. Our fax number is [phone].  If you have an urgent issue when the clinic is closed that cannot wait until the next business day, you can page your doctor at the number below.    Please note that while we do our best to be available for urgent issues outside of office hours, we are not available 24/7.   If you have  an urgent issue and are unable to reach [place], you may choose to seek medical care at your doctor's office, retail clinic, urgent care center, or emergency room.  If you have a medical emergency, please immediately call 911 or go to the emergency department.  Pager Numbers  - Dr. Nehemiah Massed: [phone]  - Dr. Laurence Ferrari: [phone]  - Dr. Nicole Kindred: [phone]  In the event of inclement weather, please call our main line at [phone] for an update on the status of any delays or closures.  Dermatology Medication Tips: Please keep the boxes that topical medications come in in order to help keep track of the instructions about where and how to use these. Pharmacies typically print the medication instructions only on the boxes and not directly on the medication tubes.   If your medication is too expensive, please contact our office at [phone] option 4 or send [place] a message through [place].   We are unable to tell what your co-pay for medications will be in advance as this is different depending on your insurance coverage. However, we may be able to find a substitute medication at lower cost or fill out paperwork to get insurance to cover a needed medication.   If a prior authorization is required to get your medication covered by your insurance company, please allow [place] 1-2 business days to complete this process.  Drug prices often vary depending on where the prescription is filled and some pharmacies may offer cheaper prices.  The website www.goodrx.com contains coupons for medications through different pharmacies. The prices here do not account for what the cost may be with help from insurance (it may be cheaper with your insurance), but the website can give you the price if you did not use any insurance.  - You can print the associated coupon and take it with your prescription to the pharmacy.  - You may also stop by our office during regular business hours and pick up a GoodRx coupon card.   - If you need your prescription sent electronically to a different pharmacy, notify our office through [facility] or by phone at [phone] option 4.     Si Usted Necesita Algo Despus de Su Visita  Tambin puede enviarnos un mensaje a travs de Pharmacist, community. Por lo general respondemos a los mensajes de MyChart en el transcurso de 1 a 2 das hbiles.  Para renovar recetas, por favor pida a su farmacia que se ponga en contacto con nuestra oficina. Harland Dingwall de fax es [place] [phone].  Si tiene un asunto urgente cuando la clnica est cerrada y que no puede esperar hasta el siguiente da hbil, puede llamar/localizar a su doctor(a) al nmero que aparece a continuacin.   Por favor, tenga en cuenta que aunque hacemos  todo lo posible para estar disponibles para asuntos urgentes fuera del horario de oficina, no estamos disponibles las 24 horas del da, los 7 das de la [place].   Si tiene un problema urgente y no puede comunicarse con nosotros, puede optar por buscar atencin mdica  en el consultorio de su doctor(a), en una clnica privada, en un centro de atencin urgente o en una sala de emergencias.  Si tiene Engineering geologist, por favor llame inmediatamente al 911 o vaya a la sala de emergencias.  Nmeros de bper  - Dr. Nehemiah Massed: [phone]  - Dra. Moye: [phone]  - Dra. Nicole Kindred: [phone]  En caso de inclemencias del [place], por favor llame a Johnsie Kindred principal al [phone] para una actualizacin sobre el [place] de cualquier retraso o cierre.  Consejos para la medicacin en dermatologa: Por favor, guarde las cajas en las que vienen los medicamentos de uso tpico para ayudarle a seguir las instrucciones sobre dnde y cmo usarlos. Las farmacias generalmente imprimen las instrucciones del medicamento slo en las cajas y no directamente en los tubos del [place].   Si su medicamento es muy caro, por favor, pngase en contacto con Zigmund Daniel  llamando al [phone] y presione la opcin 4 o envenos un mensaje a travs de Pharmacist, community.   No podemos decirle cul ser su copago por los medicamentos por adelantado ya que esto es diferente dependiendo de la cobertura de su seguro. Sin embargo, es posible que podamos encontrar un medicamento sustituto a Electrical engineer un formulario para que el seguro cubra el medicamento que se considera necesario.   Si se requiere una autorizacin previa para que su compaa de seguros [place] su medicamento, por favor permtanos de 1 a 2 das hbiles para completar este proceso.  Los precios de los medicamentos varan con frecuencia dependiendo del Environmental consultant de dnde se surte la receta y alguna farmacias pueden ofrecer precios ms baratos.  El sitio web www.goodrx.com tiene cupones para medicamentos de Airline pilot. Los precios aqu no tienen en cuenta lo que podra costar con la ayuda del seguro (puede ser ms barato con su seguro), pero el sitio web puede darle el precio si no utiliz Research scientist (physical sciences).  - Puede imprimir el cupn correspondiente y llevarlo con su receta a la farmacia.  - Tambin puede pasar por nuestra oficina durante el horario de atencin regular y Charity fundraiser una tarjeta de cupones de GoodRx.  - Si necesita que su receta se enve electrnicamente a una farmacia diferente, informe a nuestra oficina a travs de MyChart de [place] o por telfono llamando al [phone] y presione la opcin 4.

## 2021-12-22 ENCOUNTER — Encounter: Payer: Self-pay | Admitting: Dermatology

## 2022-01-28 ENCOUNTER — Ambulatory Visit
Admission: RE | Admit: 2022-01-28 | Discharge: 2022-01-28 | Disposition: A | Discharge status: Home / Self Care | Payer: Medicare PPO | Source: Ambulatory Visit | Attending: Internal Medicine | Admitting: Internal Medicine

## 2022-01-28 DIAGNOSIS — Z1231 Encounter for screening mammogram for malignant neoplasm of breast: Secondary | ICD-10-CM | POA: Diagnosis not present

## 2022-02-05 ENCOUNTER — Ambulatory Visit: Payer: Medicare PPO | Admitting: Dermatology

## 2022-02-05 DIAGNOSIS — L578 Other skin changes due to chronic exposure to nonionizing radiation: Secondary | ICD-10-CM

## 2022-02-05 DIAGNOSIS — L570 Actinic keratosis: Secondary | ICD-10-CM | POA: Diagnosis not present

## 2022-02-05 DIAGNOSIS — L989 Disorder of the skin and subcutaneous tissue, unspecified: Secondary | ICD-10-CM

## 2022-02-05 NOTE — Progress Notes (Unsigned)
Follow-Up Visit   Subjective  Pamela Bowen is a 76 y.o. female who presents for the following: Irregular skin lesion (Sore on the L side of the nose that was very inflamed but has since improved since cleaning area with hydrogen peroxide and OTC antibiotic cream. She does still feel roughness in area and would like it checked).   The following portions of the chart were reviewed this encounter and updated as appropriate:   Tobacco  Allergies  Meds  Problems  Med Hx  Surg Hx  Fam Hx      Review of Systems:  No other skin or systemic complaints except as noted in HPI or Assessment and Plan.  Objective  Well appearing patient in no apparent distress; mood and affect are within normal limits.  A focused examination was performed including the face. Relevant physical exam findings are noted in the Assessment and Plan.  Nose Slightly rough pink macule  R Cheek Erythematous thin papules/macules with gritty scale.     Assessment & Plan  Skin lesion Nose  Resolving, possible superficial infection vs seb derm vs AK -  Start Vaseline to aa QD until healed. Continue warm water and soap daily or recommend [facility] Hypochlorous Spray (found in the wound care section) OR Cln brand Acne or Sports wash. The [facility] Hypochlorous Spray can be sprayed on daily and left on. The Cln wash should be applied to the affected area daily for at least 30 seconds and then rinsed off. If you are using clindamycin solution or lotion or another topical antibiotic to treat acne, using a hypochlorous product may help lower the risk of antibiotic resistant bacteria.    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, chronic, not at goal, secondary to cumulative UV radiation exposure over time - diffuse scaly erythematous macules and papules with underlying dyspigmentation - Discussed Prescription "Field  Treatment" for Severe, Chronic Confluent Actinic Changes with Pre-Cancerous Actinic Keratoses Field treatment involves treatment of an entire area of skin that has confluent Actinic Changes (Sun/ Ultraviolet light damage) and PreCancerous Actinic Keratoses by method of PhotoDynamic Therapy (PDT) and/or prescription Topical Chemotherapy agents such as 5-fluorouracil, 5-fluorouracil/calcipotriene, and/or imiquimod.  The purpose is to decrease the number of clinically evident and subclinical PreCancerous lesions to prevent progression to development of skin cancer by chemically destroying early precancer changes that may or may not be visible.  It has been shown to reduce the risk of developing skin cancer in the treated area. As a result of treatment, redness, scaling, crusting, and open sores may occur during treatment course. One or more than one of these methods may be used and may have to be used several times to control, suppress and eliminate the PreCancerous changes. Discussed treatment course, expected reaction, and possible side effects. - Recommend daily broad spectrum sunscreen SPF 30+ to sun-exposed areas, reapply every 2 hours as needed.  - Staying in the shade or wearing long sleeves, sun glasses (UVA+UVB protection) and wide brim hats (4-inch brim around the entire circumference of the hat) are also recommended. - Call for new or changing lesions. - Start 5FU/Calcipotriene cream BID x 4 days to the entire face OR PDT with red light. Patient to call our office in January to see if PDT with red light is available.   Return for appointment as scheduled.  Luther Redo, CMA, am acting as scribe for [employer],  MD .  Documentation: I have reviewed the above documentation for accuracy and completeness, and I agree with the above.  Forest Gleason, MD

## 2022-02-05 NOTE — Patient Instructions (Addendum)
Recommend [facility] Hypochlorous Spray (found in the wound care section) OR Cln brand Acne or Sports wash. The [facility] Hypochlorous Spray can be sprayed on daily and left on. The Cln wash should be applied to the affected area daily for at least 30 seconds and then rinsed off. If you are using clindamycin solution or lotion or another topical antibiotic to treat acne, using a hypochlorous product may help lower the risk of antibiotic resistant bacteria.   5-Fluorouracil/Calcipotriene Patient Education   Actinic keratoses are the dry, red scaly spots on the skin caused by sun damage. A portion of these spots can turn into skin cancer with time, and treating them can help prevent development of skin cancer.   Treatment of these spots requires removal of the defective skin cells. There are various ways to remove actinic keratoses, including freezing with liquid nitrogen, treatment with creams, or treatment with a blue light procedure in the office.   5-fluorouracil cream is a topical cream used to treat actinic keratoses. It works by interfering with the growth of abnormal fast-growing skin cells, such as actinic keratoses. These cells peel off and are replaced by healthy ones.   5-fluorouracil/calcipotriene is a combination of the 5-fluorouracil cream with a vitamin D analog cream called calcipotriene. The calcipotriene alone does not treat actinic keratoses. However, when it is combined with 5-fluorouracil, it helps the 5-fluorouracil treat the actinic keratoses much faster so that the same results can be achieved with a much shorter treatment time.  INSTRUCTIONS FOR 5-FLUOROURACIL/CALCIPOTRIENE CREAM:   5-fluorouracil/calcipotriene cream typically only needs to be used for 4-7 days. A thin layer should be applied twice a day to the treatment areas recommended by your physician.   If your physician prescribed you separate tubes of 5-fluourouracil and calcipotriene, apply a thin layer of  5-fluorouracil followed by a thin layer of calcipotriene.   Avoid contact with your eyes, nostrils, and mouth. Do not use 5-fluorouracil/calcipotriene cream on infected or open wounds.   You will develop redness, irritation and some crusting at areas where you have pre-cancer damage/actinic keratoses. IF YOU DEVELOP PAIN, BLEEDING, OR SIGNIFICANT CRUSTING, STOP THE TREATMENT EARLY - you have already gotten a good response and the actinic keratoses should clear up well.  Wash your hands after applying 5-fluorouracil 5% cream on your skin.   A moisturizer or sunscreen with a minimum SPF 30 should be applied each morning.   Once you have finished the treatment, you can apply a thin layer of Vaseline twice a day to irritated areas to soothe and calm the areas more quickly. If you experience significant discomfort, contact your physician.  For some patients it is necessary to repeat the treatment for best results.  SIDE EFFECTS: When using 5-fluorouracil/calcipotriene cream, you may have mild irritation, such as redness, dryness, swelling, or a mild burning sensation. This usually resolves within 2 weeks. The more actinic keratoses you have, the more redness and inflammation you can expect during treatment. Eye irritation has been reported rarely. If this occurs, please let us know.   If you have any trouble using this cream, please call the office. If you have any other questions about this information, please do not hesitate to ask me before you leave the office.    Due to recent changes in healthcare laws, you may see results of your pathology and/or laboratory studies on MyChart before the doctors have had a chance to review them. We understand that in some cases there may be results that are  confusing or concerning to you. Please understand that not all results are received at the same time and often the doctors may need to interpret multiple results in order to provide you with the best plan of  care or course of treatment. Therefore, we ask that you please give [place] 2 business days to thoroughly review all your results before contacting the office for clarification. Should we see a critical lab result, you will be contacted sooner.   If You Need Anything After Your Visit  If you have any questions or concerns for your doctor, please call our main line at [phone] and press option 4 to reach your doctor's medical assistant. If no one answers, please leave a voicemail as directed and we will return your call as soon as possible. Messages left after 4 pm will be answered the following business day.   You may also send [place] a message via [place]. We typically respond to MyChart messages within 1-2 business days.  For prescription refills, please ask your pharmacy to contact our office. Our fax number is [phone].  If you have an urgent issue when the clinic is closed that cannot wait until the next business day, you can page your doctor at the number below.    Please note that while we do our best to be available for urgent issues outside of office hours, we are not available 24/7.   If you have an urgent issue and are unable to reach [place], you may choose to seek medical care at your doctor's office, retail clinic, urgent care center, or emergency room.  If you have a medical emergency, please immediately call 911 or go to the emergency department.  Pager Numbers  - Dr. Nehemiah Massed: [phone]  - Dr. Laurence Ferrari: [phone]  - Dr. Nicole Kindred: [phone]  In the event of inclement weather, please call our main line at [phone] for an update on the status of any delays or closures.  Dermatology Medication Tips: Please keep the boxes that topical medications come in in order to help keep track of the instructions about where and how to use these. Pharmacies typically print the medication instructions only on the boxes and not directly on the medication tubes.   If your medication is  too expensive, please contact our office at [phone] option 4 or send [place] a message through [place].   We are unable to tell what your co-pay for medications will be in advance as this is different depending on your insurance coverage. However, we may be able to find a substitute medication at lower cost or fill out paperwork to get insurance to cover a needed medication.   If a prior authorization is required to get your medication covered by your insurance company, please allow [place] 1-2 business days to complete this process.  Drug prices often vary depending on where the prescription is filled and some pharmacies may offer cheaper prices.  The website www.goodrx.com contains coupons for medications through different pharmacies. The prices here do not account for what the cost may be with help from insurance (it may be cheaper with your insurance), but the website can give you the price if you did not use any insurance.  - You can print the associated coupon and take it with your prescription to the pharmacy.  - You may also stop by our office during regular business hours and pick up a GoodRx coupon card.  - If you need your prescription sent electronically to a different pharmacy, notify our  office through [facility] or by phone at [phone] option 4.     Si Usted Necesita Algo Despus de Su Visita  Tambin puede enviarnos un mensaje a travs de Pharmacist, community. Por lo general respondemos a los mensajes de MyChart en el transcurso de 1 a 2 das hbiles.  Para renovar recetas, por favor pida a su farmacia que se ponga en contacto con nuestra oficina. Harland Dingwall de fax es [place] [phone].  Si tiene un asunto urgente cuando la clnica est cerrada y que no puede esperar hasta el siguiente da hbil, puede llamar/localizar a su doctor(a) al nmero que aparece a continuacin.   Por favor, tenga en cuenta que aunque hacemos todo lo posible para estar disponibles para asuntos urgentes  fuera del horario de [place], no estamos disponibles las 24 horas del da, los 7 das de la [place].   Si tiene un problema urgente y no puede comunicarse con nosotros, puede optar por buscar atencin mdica  en el consultorio de su doctor(a), en una clnica privada, en un centro de atencin urgente o en una sala de emergencias.  Si tiene Engineering geologist, por favor llame inmediatamente al 911 o vaya a la sala de emergencias.  Nmeros de bper  - Dr. Nehemiah Massed: [phone]  - Dra. Moye: [phone]  - Dra. Nicole Kindred: [phone]  En caso de inclemencias del [place], por favor llame a Johnsie Kindred principal al [phone] para una actualizacin sobre el [place] de cualquier retraso o cierre.  Consejos para la medicacin en dermatologa: Por favor, guarde las cajas en las que vienen los medicamentos de uso tpico para ayudarle a seguir las instrucciones sobre dnde y cmo usarlos. Las farmacias generalmente imprimen las instrucciones del medicamento slo en las cajas y no directamente en los tubos del [place].   Si su medicamento es muy caro, por favor, pngase en contacto con Zigmund Daniel llamando al [phone] y presione la opcin 4 o envenos un mensaje a travs de Pharmacist, community.   No podemos decirle cul ser su copago por los medicamentos por adelantado ya que esto es diferente dependiendo de la cobertura de su seguro. Sin embargo, es posible que podamos encontrar un medicamento sustituto a Electrical engineer un formulario para que el seguro cubra el medicamento que se considera necesario.   Si se requiere una autorizacin previa para que su compaa de seguros [place] su medicamento, por favor permtanos de 1 a 2 das hbiles para completar este proceso.  Los precios de los medicamentos varan con frecuencia dependiendo del Environmental consultant de dnde se surte la receta y alguna farmacias pueden ofrecer precios ms baratos.  El sitio web www.goodrx.com tiene cupones para medicamentos de  Airline pilot. Los precios aqu no tienen en cuenta lo que podra costar con la ayuda del seguro (puede ser ms barato con su seguro), pero el sitio web puede darle el precio si no utiliz Research scientist (physical sciences).  - Puede imprimir el cupn correspondiente y llevarlo con su receta a la farmacia.  - Tambin puede pasar por nuestra oficina durante el horario de atencin regular y Charity fundraiser una tarjeta de cupones de GoodRx.  - Si necesita que su receta se enve electrnicamente a una farmacia diferente, informe a nuestra oficina a travs de MyChart de [place] o por telfono llamando al [phone] y presione la opcin 4.

## 2022-02-06 ENCOUNTER — Encounter: Payer: Self-pay | Admitting: Dermatology

## 2022-06-10 ENCOUNTER — Ambulatory Visit (INDEPENDENT_AMBULATORY_CARE_PROVIDER_SITE_OTHER): Payer: Medicare PPO | Admitting: Dermatology

## 2022-06-10 DIAGNOSIS — L570 Actinic keratosis: Secondary | ICD-10-CM

## 2022-06-10 NOTE — Patient Instructions (Signed)

## 2022-06-11 ENCOUNTER — Encounter: Payer: Self-pay | Admitting: Dermatology

## 2022-06-11 MED ORDER — AMINOLEVULINIC ACID HCL 10 % EX GEL
2000.0000 mg | Freq: Once | CUTANEOUS | Status: AC
  Administered 2022-06-11: 2000 mg via TOPICAL

## 2022-06-11 NOTE — Progress Notes (Signed)
Patient completed red light photodynamic therapy today.  1. AK (actinic keratosis) Head - Anterior (Face)  Photodynamic therapy - Head - Anterior (Face) Procedure discussed: discussed risks, benefits, side effects. and alternatives   Prep: site scrubbed/prepped with acetone   Debridement needed: Yes   Location:  Face Number of lesions:  Multiple Type of treatment:  Red light Aminolevulinic Acid (see MAR for details): Ameluz Amount of Ameluz (mg):  1 Incubation time (minutes):  60 Number of minutes under lamp:  20 Number of seconds under lamp:  0 Cooling:  Floor fan and fan Outcome: patient tolerated procedure well with no complications   Post-procedure details: sunscreen applied    Related Medications Aminolevulinic Acid HCl 10 % GEL 2,000 mg   Johnsie Kindred, RMA  I personally debrided area prior to application of aminolevulinic acid  Documentation: I have reviewed the above documentation for accuracy and completeness, and I agree with the above.  Forest Gleason, MD

## 2022-06-20 ENCOUNTER — Telehealth: Payer: Medicare PPO | Admitting: Physician Assistant

## 2022-06-20 DIAGNOSIS — B370 Candidal stomatitis: Secondary | ICD-10-CM

## 2022-06-20 MED ORDER — NYSTATIN 100000 UNIT/ML MT SUSP: 5.0000 mL | Freq: Four times a day (QID) | OROMUCOSAL | 0 refills | Status: AC

## 2022-06-20 NOTE — Progress Notes (Signed)
Virtual Visit Consent   Pamela Bowen, you are scheduled for a virtual visit with a [facility] provider today. Just as with appointments in the office, your consent must be obtained to participate. Your consent will be active for this visit and any virtual visit you may have with one of our providers in the next 365 days. If you have a MyChart account, a copy of this consent can be sent to you electronically.  As this is a virtual visit, video technology does not allow for your provider to perform a traditional examination. This may limit your provider's ability to fully assess your condition. If your provider identifies any concerns that need to be evaluated in person or the need to arrange testing (such as labs, EKG, etc.), we will make arrangements to do so. Although advances in technology are sophisticated, we cannot ensure that it will always work on either your end or our end. If the connection with a video visit is poor, the visit may have to be switched to a telephone visit. With either a video or telephone visit, we are not always able to ensure that we have a secure connection.  By engaging in this virtual visit, you consent to the provision of healthcare and authorize for your insurance to be billed (if applicable) for the services provided during this visit. Depending on your insurance coverage, you may receive a charge related to this service.  I need to obtain your verbal consent now. Are you willing to proceed with your visit today? [name] has provided verbal consent on 06/20/2022 for a virtual visit (video or telephone). Kennieth Rad, PA-C  Date: 06/20/2022 4:10 PM  Virtual Visit via Video Note   I, [place], connected with  Pamela Bowen  ([id number], 01/15/1946) on 06/20/22 at  4:00 PM EST by a video-enabled telemedicine application and verified that I am speaking with the correct person using two identifiers.  Location: Patient: Virtual Visit Location  Patient: Home Provider: Virtual Visit Location Provider: Home Office   I discussed the limitations of evaluation and management by telemedicine and the availability of in person appointments. The patient expressed understanding and agreed to proceed.    History of Present Illness: Pamela Bowen is a 77 y.o. who identifies as a female who was assigned female at birth, and is being seen today for a sore mouth.  States the her symptoms started about 2-3 days ago, starting with a sore gum that she thought was from biting down on a cracker.  States that now the inside of her mouth is burning, tender and sore.  States that she is eating and drinking okay, but carbonated beverages are painful  Has been using warm salt water with little relief.   Problems: There are no problems to display for this patient.   Allergies:  Allergies  Allergen Reactions   Codeine Nausea And Vomiting   Iodine Hives    IV   Lovastatin     Muscle aches    Simvastatin    Medications:  Current Outpatient Medications:    nystatin (MYCOSTATIN) 100000 UNIT/ML suspension, Take 5 mLs (500,000 Units total) by mouth 4 (four) times daily., Disp: 60 mL, Rfl: 0   albuterol (PROVENTIL HFA;VENTOLIN HFA) 108 (90 Base) MCG/ACT inhaler, Inhale 1 puff into the lungs every 6 (six) hours as needed for wheezing or shortness of breath., Disp: , Rfl:    amLODipine (NORVASC) 10 MG tablet, Take 10 mg by mouth daily.,  Disp: , Rfl:    atorvastatin (LIPITOR) 10 MG tablet, Take 10 mg by mouth daily., Disp: , Rfl:    azelastine (ASTELIN) 0.1 % nasal spray, Place 1 spray into both nostrils 2 (two) times daily. Use in each nostril as directed, Disp: , Rfl:    busPIRone (BUSPAR) 5 MG tablet, Take 5 mg by mouth 2 (two) times daily., Disp: , Rfl:    calcium carbonate (OSCAL) 1500 (600 Ca) MG TABS tablet, Take by mouth 2 (two) times daily with a meal., Disp: , Rfl:    Cholecalciferol (VITAMIN D) 2000 units CAPS, Take by mouth., Disp: , Rfl:     Dupilumab (DUPIXENT) 300 MG/2ML SOPN, Inject into the skin., Disp: , Rfl:    fluorouracil (EFUDEX) 5 % cream, Apply topically 2 (two) times daily. For 4 days as directed (Patient not taking: Reported on 02/05/2022), Disp: 15 g, Rfl: 1   fluticasone (FLONASE) 50 MCG/ACT nasal spray, Place 2 sprays into both nostrils daily., Disp: , Rfl:    hydrochlorothiazide (MICROZIDE) 12.5 MG capsule, Take 12.5 mg by mouth daily., Disp: , Rfl:    ibuprofen (ADVIL,MOTRIN) 400 MG tablet, Take 400 mg by mouth as needed., Disp: , Rfl:    levothyroxine (SYNTHROID, LEVOTHROID) 75 MCG tablet, Take 75 mcg by mouth daily before breakfast., Disp: , Rfl:    loratadine (CLARITIN) 10 MG tablet, Take 10 mg by mouth daily as needed for allergies., Disp: , Rfl:    Olopatadine HCl 0.2 % SOLN, Apply 1 drop to eye as needed., Disp: , Rfl:    Omega-3 Fatty Acids (FISH OIL) 1000 MG CAPS, Take by mouth., Disp: , Rfl:    potassium chloride (KLOR-CON) 10 MEQ tablet, Take 10 mEq by mouth 2 (two) times daily., Disp: , Rfl:    tacrolimus (PROTOPIC) 0.1 % ointment, Apply to affected skin twice a day prn, Disp: 100 g, Rfl: 1  Observations/Objective: Patient is well-developed, well-nourished in no acute distress.  Resting comfortably  at home.  Head is normocephalic, atraumatic.  No labored breathing.  Speech is clear and coherent with logical content.  Patient is alert and oriented at baseline.    Assessment and Plan: 1. Thrush - nystatin (MYCOSTATIN) 100000 UNIT/ML suspension; Take 5 mLs (500,000 Units total) by mouth 4 (four) times daily.  Dispense: 60 mL; Refill: 0  Trial Mycostatin, patient education given on supportive care, red flags given for prompt reevaluation  Follow Up Instructions: I discussed the assessment and treatment plan with the patient. The patient was provided an opportunity to ask questions and all were answered. The patient agreed with the plan and demonstrated an understanding of the instructions.  A copy of  instructions were sent to the patient via MyChart unless otherwise noted below.     The patient was advised to call back or seek an in-person evaluation if the symptoms worsen or if the condition fails to improve as anticipated.  Time:  I spent 12 minutes with the patient via telehealth technology discussing the above problems/concerns.      Loraine Grip Mayers, PA-C

## 2022-06-20 NOTE — Patient Instructions (Addendum)
Devota Pace, thank you for joining Kennieth Rad, PA-C for today's virtual visit.  While this provider is not your primary care provider (PCP), if your PCP is located in our provider database this encounter information will be shared with them immediately following your visit.   [facility] account gives you access to today's visit and all your visits, tests, and labs performed at [facility] " click here if you don't have a [facility] account or go to [email or web address]  Consent: (Patient) Fidencia Mccloud Manuele provided verbal consent for this virtual visit at the beginning of the encounter.  Current Medications:  Current Outpatient Medications:    nystatin (MYCOSTATIN) 100000 UNIT/ML suspension, Take 5 mLs (500,000 Units total) by mouth 4 (four) times daily., Disp: 60 mL, Rfl: 0   albuterol (PROVENTIL HFA;VENTOLIN HFA) 108 (90 Base) MCG/ACT inhaler, Inhale 1 puff into the lungs every 6 (six) hours as needed for wheezing or shortness of breath., Disp: , Rfl:    amLODipine (NORVASC) 10 MG tablet, Take 10 mg by mouth daily., Disp: , Rfl:    atorvastatin (LIPITOR) 10 MG tablet, Take 10 mg by mouth daily., Disp: , Rfl:    azelastine (ASTELIN) 0.1 % nasal spray, Place 1 spray into both nostrils 2 (two) times daily. Use in each nostril as directed, Disp: , Rfl:    busPIRone (BUSPAR) 5 MG tablet, Take 5 mg by mouth 2 (two) times daily., Disp: , Rfl:    calcium carbonate (OSCAL) 1500 (600 Ca) MG TABS tablet, Take by mouth 2 (two) times daily with a meal., Disp: , Rfl:    Cholecalciferol (VITAMIN D) 2000 units CAPS, Take by mouth., Disp: , Rfl:    Dupilumab (DUPIXENT) 300 MG/2ML SOPN, Inject into the skin., Disp: , Rfl:    fluorouracil (EFUDEX) 5 % cream, Apply topically 2 (two) times daily. For 4 days as directed (Patient not taking: Reported on 02/05/2022), Disp: 15 g, Rfl: 1   fluticasone (FLONASE) 50 MCG/ACT nasal spray, Place 2 sprays into both nostrils  daily., Disp: , Rfl:    hydrochlorothiazide (MICROZIDE) 12.5 MG capsule, Take 12.5 mg by mouth daily., Disp: , Rfl:    ibuprofen (ADVIL,MOTRIN) 400 MG tablet, Take 400 mg by mouth as needed., Disp: , Rfl:    levothyroxine (SYNTHROID, LEVOTHROID) 75 MCG tablet, Take 75 mcg by mouth daily before breakfast., Disp: , Rfl:    loratadine (CLARITIN) 10 MG tablet, Take 10 mg by mouth daily as needed for allergies., Disp: , Rfl:    Olopatadine HCl 0.2 % SOLN, Apply 1 drop to eye as needed., Disp: , Rfl:    Omega-3 Fatty Acids (FISH OIL) 1000 MG CAPS, Take by mouth., Disp: , Rfl:    potassium chloride (KLOR-CON) 10 MEQ tablet, Take 10 mEq by mouth 2 (two) times daily., Disp: , Rfl:    tacrolimus (PROTOPIC) 0.1 % ointment, Apply to affected skin twice a day prn, Disp: 100 g, Rfl: 1   Medications ordered in this encounter:  Meds ordered this encounter  Medications   nystatin (MYCOSTATIN) 100000 UNIT/ML suspension    Sig: Take 5 mLs (500,000 Units total) by mouth 4 (four) times daily.    Dispense:  60 mL    Refill:  0    Order Specific Question:   Supervising Provider    Answer:   Chase Picket [[id number]]     *If you need refills on other medications prior to your next appointment, please contact your pharmacy*  Follow-Up: Call back or seek an in-person evaluation if the symptoms worsen or if the condition fails to improve as anticipated.  [place] [phone]  Other Instructions Oral Thrush, Adult Oral thrush, also called oral candidiasis, is a fungal infection that develops in the mouth and throat and on the tongue. It causes white patches to form in the mouth and on the tongue. Many cases of thrush are mild, but this infection can also be serious. Ritta Slot can be a repeated (recurrent) problem for certain people who have a weak body defense system (immune system). The weakness can be caused by chronic illnesses, or by taking medicines that limit the body's ability to fight  infection. If a person has difficulty fighting infection, the fungus that causes thrush can spread through the body. This can cause life-threatening blood or organ infections. What are the causes? This condition is caused by a fungus (yeast) called Candida albicans. This fungus is normally present in small amounts in the mouth and on other mucous membranes. It usually causes no harm. If conditions are present that allow the fungus to grow without control, it invades surrounding tissues and becomes an infection. Other Candida species can also lead to thrush, though this is rare. What increases the risk? The following factors may make you more likely to develop this condition: Having a weakened immune system. Being an older adult. Having diabetes, cancer, or HIV (human immunodeficiency virus). Having dry mouth (xerostomia). Being pregnant or breastfeeding. Having poor dental care, especially in those who have dentures. Using antibiotic or steroid medicines. What are the signs or symptoms? Symptoms of this condition can vary from mild and moderate to severe and persistent. Symptoms may include: A burning feeling in the mouth and throat. This can occur at the start of a thrush infection. White patches that stick to the mouth and tongue. The tissue around the patches may be red, raw, and painful. If rubbed (during tooth brushing, for example), the patches and the tissue of the mouth may bleed easily. A bad taste in the mouth or difficulty tasting foods. A cottony feeling in the mouth. Pain during eating and swallowing. Poor appetite. Cracking at the corners of the mouth. How is this diagnosed? This condition is diagnosed based on: A physical exam. Your medical history. How is this treated? This condition is treated with medicines called antifungals, which prevent the growth of fungi. These medicines are either applied directly to the affected area (topical) or swallowed (oral). The treatment  will depend on the severity of the condition. Mild cases of thrush may be treated with an antifungal mouth rinse or lozenges. Treatment usually lasts about 14 days. Moderate to severe cases of thrush can be treated with oral antifungal medicine, if they have spread to the esophagus. A topical antifungal medicine may also be used. For some severe infections, treatment may need to continue for more than 14 days. Oral antifungal medicines are rarely used during pregnancy because they may be harmful to the unborn child. If you are pregnant, talk with your health care provider about options for treatment. Persistent or recurrent thrush. For cases of thrush that do not go away or keep coming back: Treatment may be needed twice as long as the symptoms last. Treatment will include both oral and topical antifungal medicines. People with a weakened immune system can take an antifungal medicine on a continuous basis to prevent thrush infections. It is important to treat conditions that make a person more likely to  get thrush, such as diabetes or HIV. Follow these instructions at home: Relieving soreness and discomfort To help reduce the discomfort of thrush: Drink cold liquids such as water or iced tea. Try flavored ice treats or frozen juices. Eat foods that are easy to swallow, such as gelatin, ice cream, or custard. Try drinking from a straw if the patches in your mouth are painful.  General instructions Take or use over-the-counter and prescription medicines only as told by your health care provider. Eat plain, unflavored yogurt as directed by your health care provider. Check the label to make sure the yogurt contains live cultures. This yogurt can help healthy bacteria grow in the mouth and can stop the growth of the fungus that causes thrush. If you wear dentures, remove the dentures before going to bed, brush them vigorously, and soak them in a cleaning solution as directed by your health care  provider. Rinse your mouth with a warm salt-water mixture several times a day. To make a salt-water mixture, dissolve -1 tsp (3-6 g) of salt in 1 cup (237 mL) of warm water. Contact a health care provider if: Your symptoms are getting worse or are not improving within 7 days of starting treatment. You have symptoms of a spreading infection, such as white patches on the skin outside of the mouth. You are breastfeeding your baby and you have redness and pain in the nipples. Summary Oral thrush, also called oral candidiasis, is a fungal infection that develops in the mouth and throat and on the tongue. It causes white patches to form in the mouth and on the tongue. You are more likely to get this condition if you have a weakened immune system or an underlying condition, such as HIV, cancer, or diabetes. This condition is treated with medicines called antifungals, which prevent the growth of fungi. Contact a health care provider if your symptoms do not improve, or get worse, within 7 days of starting treatment. This information is not intended to replace advice given to you by your health care provider. Make sure you discuss any questions you have with your health care provider. Document Revised: 01/08/2021 Document Reviewed: 03/17/2019 Elsevier Patient Education  [place].    If you have been instructed to have an in-person evaluation today at a local Urgent Care facility, please use the link below. It will take you to a list of all of our available [facility], including address, phone number and hours of operation. Please do not delay care.  [facility]  If you or a family member do not have a primary care provider, use the link below to schedule a visit and establish care. When you choose a [facility] primary care physician or advanced practice provider, you gain a long-term partner in health. Find a Primary Care Provider  Learn more about [facility]'s  in-office and virtual care options: [facility]

## 2022-07-22 ENCOUNTER — Encounter: Payer: Self-pay | Admitting: Dermatology

## 2022-07-22 ENCOUNTER — Ambulatory Visit: Payer: Medicare PPO | Admitting: Dermatology

## 2022-07-22 VITALS — BP 148/69 | HR 62

## 2022-07-22 DIAGNOSIS — L578 Other skin changes due to chronic exposure to nonionizing radiation: Secondary | ICD-10-CM | POA: Diagnosis not present

## 2022-07-22 DIAGNOSIS — L309 Dermatitis, unspecified: Secondary | ICD-10-CM | POA: Diagnosis not present

## 2022-07-22 DIAGNOSIS — L570 Actinic keratosis: Secondary | ICD-10-CM

## 2022-07-22 NOTE — Progress Notes (Signed)
Follow-Up Visit   Subjective  Pamela Bowen is a 77 y.o. female who presents for the following: Actinic Keratosis (S/P red light PDT - patient would not like to repeat treatment because she has very sensitive eyes and the red light hurt her eyes terribly). Pt states that hand dermatitis has been flaring due to winter months.   The following portions of the chart were reviewed this encounter and updated as appropriate:   Tobacco  Allergies  Meds  Problems  Med Hx  Surg Hx  Fam Hx      Review of Systems:  No other skin or systemic complaints except as noted in HPI or Assessment and Plan.  Objective  Well appearing patient in no apparent distress; mood and affect are within normal limits.  A focused examination was performed including the face and ears. Relevant physical exam findings are noted in the Assessment and Plan.  R zygoma x 1, R nasal supra tip x 1, L nasal ala x 1, L cheek x 1, L ear at tragus x 1, bridge of the nose x 1 (6) Erythematous thin papules/macules with gritty scale.   B/L hands Scaly pink plaques, xerosis    Assessment & Plan  AK (actinic keratosis) (6) R zygoma x 1, R nasal supra tip x 1, L nasal ala x 1, L cheek x 1, L ear at tragus x 1, bridge of the nose x 1  Actinic keratoses are precancerous spots that appear secondary to cumulative UV radiation exposure/sun exposure over time. They are chronic with expected duration over 1 year. A portion of actinic keratoses will progress to squamous cell carcinoma of the skin. It is not possible to reliably predict which spots will progress to skin cancer and so treatment is recommended to prevent development of skin cancer.  Recommend daily broad spectrum sunscreen SPF 30+ to sun-exposed areas, reapply every 2 hours as needed.  Recommend staying in the shade or wearing long sleeves, sun glasses (UVA+UVB protection) and wide brim hats (4-inch brim around the entire circumference of the hat). Call for new or  changing lesions.  Prior to procedure, discussed risks of blister formation, small wound, skin dyspigmentation, or rare scar following cryotherapy. Recommend Vaseline ointment to treated areas while healing.   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 Condition is bothersome to patient. Not at goal/flaring again.   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  While there is no cure, the rash and symptoms can be managed with topical prescription medications, and for more severe cases, with systemic medications.  Recommend mild soap and routine use of moisturizing cream after handwashing.  Minimize soap/water exposure when possible.    Restart clobetasol ointment twice a day as needed up to 2 weeks, then on weekends only. Cont Tacrolimus  ointment apply to affected hands twice a day. Cont otc [employer] Cream daily. Start Aquaphor ointment QHS while sleeping.    Only use clobetasol ointment twice a day as needed up to two weeks to minimize risk of thinning of the skin. Once under control may use Clobetasol QD-BID on the weekends only and Protopic daily during the week.   Topical steroids (such as triamcinolone, fluocinolone, fluocinonide, mometasone, clobetasol, halobetasol, betamethasone, hydrocortisone) can cause thinning and lightening  of the skin if they are used for too long in the same area. Your physician has selected the right strength medicine for your problem and area affected on the body. Please use your medication only as directed by your physician to prevent side effects.    Reviewed gentle skin care and rec for gloves with dishwashing.  Related Medications tacrolimus (PROTOPIC) 0.1 % ointment Apply to affected skin twice a day prn   Actinic Damage - chronic, secondary to cumulative UV radiation exposure/sun exposure over time - diffuse scaly erythematous macules with underlying dyspigmentation - Recommend daily broad spectrum sunscreen SPF 30+ to sun-exposed areas, reapply every 2 hours as needed.  - Recommend staying in the shade or wearing long sleeves, sun glasses (UVA+UVB protection) and wide brim hats (4-inch brim around the entire circumference of the hat). - Call for new or changing lesions.  Return in about 6 months (around 01/20/2023) for TBSE.  Luther Redo, CMA, am acting as scribe for Forest Gleason, MD .  Documentation: I have reviewed the above documentation for accuracy and completeness, and I agree with the above.  Forest Gleason, MD

## 2022-07-22 NOTE — Patient Instructions (Addendum)
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  While there is no cure, the rash and symptoms can be managed with topical prescription medications, and for more severe cases, with systemic medications.  Recommend mild soap and routine use of moisturizing cream after handwashing.  Minimize soap/water exposure when possible.     Continue Tacrolimus ointment apply to affected hands twice a day during the week.  Cont otc O'Keeffe's Working Ecologist Cream daily    Only use clobetasol ointment twice a day as needed up to two weeks to minimize risk of thinning of the skin. Once dermatitis is under control may continue Clobetasol 0.05% ointment on the weekends only.  Topical steroids (such as triamcinolone, fluocinolone, fluocinonide, mometasone, clobetasol, halobetasol, betamethasone, hydrocortisone) can cause thinning and lightening of the skin if they are used for too long in the same area. Your physician has selected the right strength medicine for your problem and area affected on the body. Please use your medication only as directed by your physician to prevent side effects.    Some Recommended Sunscreens Include:  Good for Daily Wear (feels like lotion but NOT sweat resistant) Cerave AM Moisturizer with SPF EltaMD UV Lotion  Body or All Over Sunscreen EltaMD UV active for body and face Blue lizard sensitive Sun bum mineral (avoid if sensitive to scent) Aveeno Positively Mineral Neutrogena sheer zinc (Slightly harder to rub in) [facility] clear zinc (Slightly harder to rub in)  Clear Face Sunscreen EltaMD UV Elements CeraVe hydrating sunscreen 50 face  Tinted Face Sunscreen Alastin Hydratint (good for most skin tones, may be slightly dark if you are very fair) Colorescience Sunforgettable Total Protection Face Shield (good for most skin tones) EltaMD UV Physical La Roche Posay Mineral Tinted Cotz Flawless Complexion   Powder Sunscreen (Nice for  reapplying or applying on the go) Colorescience Sunforgettable Total Protection Brush on Shield (available in different tints)  Face Sunscreen Available in Different Tints Colorescience Sunforgettable Total Protection Brush on Shield  bareMinerals Complexion Rescue Tinted Hydrating Gel Cream Broad Spectrum SPF 30 UnSun mineral tinted (comes in medium/dark and light/medium)  Kids (over 6 months) - Mineral Sunscreens Recommended eltaMD UV Pure MDsolarSciences KidStick 40 SPF Aveeno Baby Continuous Protection Sensitive Zinc Oxide Blue Lizard Kids mineral based sunscreen lotion Mustela Mineral Sunscreen for face and body Neutrogena Sheer Zinc Kids Sunscreen Stick  Tinted to look like a tan PCAskin sheer tint body spray    Due to recent changes in healthcare laws, you may see results of your pathology and/or laboratory studies on MyChart before the doctors have had a chance to review them. We understand that in some cases there may be results that are confusing or concerning to you. Please understand that not all results are received at the same time and often the doctors may need to interpret multiple results in order to provide you with the best plan of care or course of treatment. Therefore, we ask that you please give [place] 2 business days to thoroughly review all your results before contacting the office for clarification. Should we see a critical lab result, you will be contacted sooner.   If You Need Anything After Your Visit  If you have any questions or concerns for your doctor, please call our main line at [phone] and press option 4 to reach your doctor's medical assistant. If no one answers, please leave a voicemail as directed and we will return your call as  soon as possible. Messages left after 4 pm will be answered the following business day.   You may also send [place] a message via [place]. We typically respond to MyChart messages within 1-2 business days.  For prescription  refills, please ask your pharmacy to contact our office. Our fax number is [phone].  If you have an urgent issue when the clinic is closed that cannot wait until the next business day, you can page your doctor at the number below.    Please note that while we do our best to be available for urgent issues outside of office hours, we are not available 24/7.   If you have an urgent issue and are unable to reach [place], you may choose to seek medical care at your doctor's office, retail clinic, urgent care center, or emergency room.  If you have a medical emergency, please immediately call 911 or go to the emergency department.  Pager Numbers  - Dr. Nehemiah Massed: [phone]  - Dr. Laurence Ferrari: [phone]  - Dr. Nicole Kindred: [phone]  In the event of inclement weather, please call our main line at [phone] for an update on the status of any delays or closures.  Dermatology Medication Tips: Please keep the boxes that topical medications come in in order to help keep track of the instructions about where and how to use these. Pharmacies typically print the medication instructions only on the boxes and not directly on the medication tubes.   If your medication is too expensive, please contact our office at [phone] option 4 or send [place] a message through [place].   We are unable to tell what your co-pay for medications will be in advance as this is different depending on your insurance coverage. However, we may be able to find a substitute medication at lower cost or fill out paperwork to get insurance to cover a needed medication.   If a prior authorization is required to get your medication covered by your insurance company, please allow [place] 1-2 business days to complete this process.  Drug prices often vary depending on where the prescription is filled and some pharmacies may offer cheaper prices.  The website www.goodrx.com contains coupons for medications through different pharmacies. The  prices here do not account for what the cost may be with help from insurance (it may be cheaper with your insurance), but the website can give you the price if you did not use any insurance.  - You can print the associated coupon and take it with your prescription to the pharmacy.  - You may also stop by our office during regular business hours and pick up a GoodRx coupon card.  - If you need your prescription sent electronically to a different pharmacy, notify our office through [facility] or by phone at [phone] option 4.     Si Usted Necesita Algo Despus de Su Visita  Tambin puede enviarnos un mensaje a travs de Pharmacist, community. Por lo general respondemos a los mensajes de MyChart en el transcurso de 1 a 2 das hbiles.  Para renovar recetas, por favor pida a su farmacia que se ponga en contacto con nuestra oficina. Harland Dingwall de fax es [place] [phone].  Si tiene un asunto urgente cuando la clnica est cerrada y que no puede esperar hasta el siguiente da hbil, puede llamar/localizar a su doctor(a) al nmero que aparece a continuacin.   Por favor, tenga en cuenta que aunque hacemos todo lo posible para estar disponibles para asuntos urgentes fuera del horario  de oficina, no estamos disponibles las 24 horas del da, los 7 das de la [place].   Si tiene un problema urgente y no puede comunicarse con nosotros, puede optar por buscar atencin mdica  en el consultorio de su doctor(a), en una clnica privada, en un centro de atencin urgente o en una sala de emergencias.  Si tiene Engineering geologist, por favor llame inmediatamente al 911 o vaya a la sala de emergencias.  Nmeros de bper  - Dr. Nehemiah Massed: [phone]  - Dra. Moye: [phone]  - Dra. Nicole Kindred: [phone]  En caso de inclemencias del [place], por favor llame a Johnsie Kindred principal al [phone] para una actualizacin sobre el [place] de cualquier retraso o cierre.  Consejos para la medicacin en  dermatologa: Por favor, guarde las cajas en las que vienen los medicamentos de uso tpico para ayudarle a seguir las instrucciones sobre dnde y cmo usarlos. Las farmacias generalmente imprimen las instrucciones del medicamento slo en las cajas y no directamente en los tubos del [place].   Si su medicamento es muy caro, por favor, pngase en contacto con Zigmund Daniel llamando al [phone] y presione la opcin 4 o envenos un mensaje a travs de Pharmacist, community.   No podemos decirle cul ser su copago por los medicamentos por adelantado ya que esto es diferente dependiendo de la cobertura de su seguro. Sin embargo, es posible que podamos encontrar un medicamento sustituto a Electrical engineer un formulario para que el seguro cubra el medicamento que se considera necesario.   Si se requiere una autorizacin previa para que su compaa de seguros [place] su medicamento, por favor permtanos de 1 a 2 das hbiles para completar este proceso.  Los precios de los medicamentos varan con frecuencia dependiendo del Environmental consultant de dnde se surte la receta y alguna farmacias pueden ofrecer precios ms baratos.  El sitio web www.goodrx.com tiene cupones para medicamentos de Airline pilot. Los precios aqu no tienen en cuenta lo que podra costar con la ayuda del seguro (puede ser ms barato con su seguro), pero el sitio web puede darle el precio si no utiliz Research scientist (physical sciences).  - Puede imprimir el cupn correspondiente y llevarlo con su receta a la farmacia.  - Tambin puede pasar por nuestra oficina durante el horario de atencin regular y Charity fundraiser una tarjeta de cupones de GoodRx.  - Si necesita que su receta se enve electrnicamente a una farmacia diferente, informe a nuestra oficina a travs de MyChart de [employer] o por telfono llamando al [phone] y presione la opcin 4.

## 2022-08-26 ENCOUNTER — Other Ambulatory Visit: Payer: Self-pay | Admitting: Internal Medicine

## 2022-08-26 DIAGNOSIS — Z1231 Encounter for screening mammogram for malignant neoplasm of breast: Secondary | ICD-10-CM

## 2022-12-09 ENCOUNTER — Ambulatory Visit: Payer: Medicare PPO | Admitting: Dermatology

## 2023-02-03 ENCOUNTER — Ambulatory Visit
Admission: RE | Admit: 2023-02-03 | Discharge: 2023-02-03 | Disposition: A | Discharge status: Home / Self Care | Payer: Medicare PPO | Source: Ambulatory Visit | Attending: Internal Medicine | Admitting: Internal Medicine

## 2023-02-03 DIAGNOSIS — Z1231 Encounter for screening mammogram for malignant neoplasm of breast: Secondary | ICD-10-CM | POA: Diagnosis present

## 2023-08-26 ENCOUNTER — Other Ambulatory Visit: Payer: Self-pay | Admitting: Internal Medicine

## 2023-08-26 DIAGNOSIS — Z1231 Encounter for screening mammogram for malignant neoplasm of breast: Secondary | ICD-10-CM

## 2023-11-18 LAB — COLOGUARD: COLOGUARD: NEGATIVE

## 2024-01-26 ENCOUNTER — Encounter

## 2024-02-09 ENCOUNTER — Ambulatory Visit
Admission: RE | Admit: 2024-02-09 | Discharge: 2024-02-09 | Disposition: A | Discharge status: Home / Self Care | Source: Ambulatory Visit | Attending: Internal Medicine | Admitting: Internal Medicine

## 2024-02-09 DIAGNOSIS — Z1231 Encounter for screening mammogram for malignant neoplasm of breast: Secondary | ICD-10-CM | POA: Diagnosis present
